# Patient Record
Sex: Male | Born: 1990 | Hispanic: Yes | Marital: Single | Smoking: Never smoker
Health system: Southern US, Community
[De-identification: ages and names within clinical notes are randomized; demographics above are authoritative.]

## PROBLEM LIST (undated history)

## (undated) ENCOUNTER — Emergency Department (HOSPITAL_COMMUNITY): Admission: EM | Payer: BC Managed Care – PPO

## (undated) DIAGNOSIS — M109 Gout, unspecified: Secondary | ICD-10-CM

## (undated) HISTORY — PX: NO PAST SURGERIES: SHX2092

---

## 2016-10-29 ENCOUNTER — Ambulatory Visit (HOSPITAL_COMMUNITY)
Admission: EM | Admit: 2016-10-29 | Discharge: 2016-10-29 | Disposition: A | Discharge status: Home / Self Care | Payer: Self-pay | Attending: Family Medicine | Admitting: Family Medicine

## 2016-10-29 ENCOUNTER — Encounter (HOSPITAL_COMMUNITY): Payer: Self-pay | Admitting: Emergency Medicine

## 2016-10-29 DIAGNOSIS — M1990 Unspecified osteoarthritis, unspecified site: Secondary | ICD-10-CM

## 2016-10-29 DIAGNOSIS — M25571 Pain in right ankle and joints of right foot: Secondary | ICD-10-CM

## 2016-10-29 MED ORDER — TRAMADOL HCL 50 MG PO TABS: ORAL_TABLET | ORAL | 0 refills | Status: DC

## 2016-10-29 MED ORDER — PREDNISONE 5 MG (48) PO TBPK: 5.0000 mg | ORAL_TABLET | Freq: Every day | ORAL | 0 refills | Status: DC

## 2016-10-29 NOTE — Discharge Instructions (Signed)
You have inflammation in your ankle that may be gout. This is most likely. If this problem returns again then please f/u at my practice. The medication will help this acutely.

## 2016-10-29 NOTE — ED Provider Notes (Signed)
CSN: [id number]     Arrival date & time 10/29/16  1435 History   First MD Initiated Contact with Patient 10/29/16 1608     Chief Complaint  Patient presents with  . Ankle Pain   (Consider location/radiation/quality/duration/timing/severity/associated sxs/prior Treatment) 26 yo male presents with pain and swelling of the right foot. Onset 3 days. No injury. "Very painful". A few weeks ago it occurred in the left foot but eventually went away. He has pain with walking and even touching the foot. No fever or chills are noted, otherwise feels well.       History reviewed. No pertinent past medical history. History reviewed. No pertinent surgical history. No family history on file. Social History  Substance Use Topics  . Smoking status: Never Smoker  . Smokeless tobacco: Not on file  . Alcohol use No    Review of Systems  Constitutional: Negative for chills and fever.  All other systems reviewed and are negative.   Allergies  Patient has no known allergies.  Home Medications   Prior to Admission medications   Medication Sig Start Date End Date Taking? Authorizing Provider  predniSONE (STERAPRED UNI-PAK 48 TAB) 5 MG (48) TBPK tablet Take 1 tablet (5 mg total) by mouth daily. Take as directed for gout flare 10/29/16   Riki SheerMichelle G Young, PA-C  traMADol Janean Sark(ULTRAM) 50 MG tablet Take 1 tablet every 6 hours as needed for pain 10/29/16   Riki SheerMichelle G Young, PA-C   Meds Ordered and Administered this Visit  Medications - No data to display  BP 130/73 (BP Location: Right Arm) Comment (BP Location): large cuff  Pulse 83   Temp 98.2 F (36.8 C) (Oral)   Resp 22   SpO2 99%  No data found.   Physical Exam  Constitutional: He is oriented to person, place, and time. He appears well-developed and well-nourished. No distress.  Musculoskeletal: He exhibits edema and tenderness. He exhibits no deformity.  Right lateral foot with erythema, warmth and swelling. Pain with touch, no open wounds or  signs of cellulitis  Neurological: He is alert and oriented to person, place, and time.  Skin: Skin is warm and dry. He is not diaphoretic.  Psychiatric: His behavior is normal.  Nursing note and vitals reviewed.   Urgent Care Course   Clinical Course     Procedures (including critical care time)  Labs Review Labs Reviewed - No data to display  Imaging Review No results found.   Visual Acuity Review  Right Eye Distance:   Left Eye Distance:   Bilateral Distance:    Right Eye Near:   Left Eye Near:    Bilateral Near:         MDM   1. Acute right ankle pain   2. Inflammatory arthritis    Inflammatory Arthritis, most likely gout given the presentation. No outward signs of infection. Treat with pred pack and Tramadol if needed. If this continues to reoccur needs a work up by rheum, information given.     Riki SheerMichelle G Young, PA-C 10/29/16 1642

## 2016-11-06 ENCOUNTER — Encounter (HOSPITAL_COMMUNITY): Payer: Self-pay | Admitting: Emergency Medicine

## 2016-11-06 ENCOUNTER — Emergency Department (HOSPITAL_COMMUNITY)
Admission: EM | Admit: 2016-11-06 | Discharge: 2016-11-06 | Disposition: A | Discharge status: Home / Self Care | Payer: Self-pay | Attending: Emergency Medicine | Admitting: Emergency Medicine

## 2016-11-06 ENCOUNTER — Emergency Department (HOSPITAL_COMMUNITY): Payer: Self-pay

## 2016-11-06 DIAGNOSIS — M25572 Pain in left ankle and joints of left foot: Secondary | ICD-10-CM | POA: Insufficient documentation

## 2016-11-06 DIAGNOSIS — M25571 Pain in right ankle and joints of right foot: Secondary | ICD-10-CM | POA: Insufficient documentation

## 2016-11-06 LAB — CBG MONITORING, ED: GLUCOSE-CAPILLARY: 121 mg/dL — AB (ref 65–99)

## 2016-11-06 MED ORDER — NAPROXEN 500 MG PO TABS: 500.0000 mg | ORAL_TABLET | Freq: Two times a day (BID) | ORAL | 0 refills | Status: DC

## 2016-11-06 NOTE — ED Triage Notes (Signed)
Pt c/o pain to B/L feet x 1 week. Pt reports pain only occurs with ambulation.

## 2016-11-06 NOTE — ED Notes (Signed)
Pt stable, understands discharge instructions, and reasons for return.   

## 2016-11-06 NOTE — ED Notes (Signed)
Pt understands dc instructions no sig pad available.

## 2016-11-06 NOTE — ED Provider Notes (Signed)
[facility]-EMERGENCY DEPT Provider Note   CSN: [id number] Arrival date & time: 11/06/16  1315     History   Chief Complaint Chief Complaint  Patient presents with  . Foot Pain    HPI Jesus Zuniga is a 26 y.o. male.  HPI Patient has had ankle pain that started right [place]. No associated injury or trauma. He reports first it was his right ankle. He reports it was more swollen previously. The pain occurs below the lateral malleolus. He reports when he puts weight on it he'll have pain in the sole of the foot. Pain has now migrated to include his left foot. Same pain distribution being inferior to the malleolus and with weightbearing pain at the sole. He reports after a while gets to be a burning quality of pain. Patient was treated with prednisone and tramadol. He reports a right ankle is somewhat better than it was previously. No other joint swellings or pain. Patient has not otherwise been ill and has a negative review of systems. No known history of diabetes. History reviewed. No pertinent past medical history.  There are no active problems to display for this patient.   History reviewed. No pertinent surgical history.     Home Medications    Prior to Admission medications   Medication Sig Start Date End Date Taking? Authorizing Provider  naproxen (NAPROSYN) 500 MG tablet Take 1 tablet (500 mg total) by mouth 2 (two) times daily. 11/06/16   Arby BarretteMarcy Lakechia Nay, MD  predniSONE (STERAPRED UNI-PAK 48 TAB) 5 MG (48) TBPK tablet Take 1 tablet (5 mg total) by mouth daily. Take as directed for gout flare 10/29/16   Riki SheerMichelle G Young, PA-C  traMADol Janean Sark(ULTRAM) 50 MG tablet Take 1 tablet every 6 hours as needed for pain 10/29/16   Riki SheerMichelle G Young, PA-C    Family History No family history on file.  Social History Social History  Substance Use Topics  . Smoking status: Never Smoker  . Smokeless tobacco: Never Used  . Alcohol use No     Allergies   Patient has no known  allergies.   Review of Systems Review of Systems 10 Systems reviewed and are negative for acute change except as noted in the HPI.   Physical Exam Updated Vital Signs BP 136/90   Pulse 90   Temp 98.3 F (36.8 C) (Oral)   Resp 20   Ht 5\' 10"  (1.778 m)   Wt (!) 390 lb 5 oz (177 kg)   SpO2 98%   BMI 56.00 kg/m   Physical Exam  Constitutional: He is oriented to person, place, and time.  Patient is alert and nontoxic. He is well in appearance. Morbid obesity.  HENT:  Head: Normocephalic and atraumatic.  Eyes: EOM are normal.  Cardiovascular: Normal rate, regular rhythm, normal heart sounds and intact distal pulses.   Pulmonary/Chest: Effort normal and breath sounds normal.  Musculoskeletal: Normal range of motion. He exhibits tenderness. He exhibits no deformity.  Patient has slight fullness of the right infra-malleolar area. Mild tenderness to palpation. No point tenderness on the bony structures. No erythema. Sole of the foot is normal. Thickening of the great toenail consistent with onychomycosis. Left foot mild tenderness below the malleolus. No significant swelling. No erythema. Skin condition normal.  Calves are soft and nontender. No effusion or pain at the knees. Joints of hands examined with no erythema swelling or pain.  Neurological: He is alert and oriented to person, place, and time. He exhibits normal muscle  tone. Coordination normal.  Skin: Skin is warm and dry.  Psychiatric: He has a normal mood and affect.     ED Treatments / Results  Labs (all labs ordered are listed, but only abnormal results are displayed) Labs Reviewed  CBG MONITORING, ED - Abnormal; Notable for the following:       Result Value   Glucose-Capillary 121 (*)    All other components within normal limits    EKG  EKG Interpretation None       Radiology Dg Ankle Complete Left  Result Date: 11/06/2016 CLINICAL DATA:  Pain without trauma EXAM: LEFT ANKLE COMPLETE - 3+ VIEW  COMPARISON:  None. FINDINGS: No fracture or dislocation.  Mild lateral soft tissue swelling. IMPRESSION: Mild lateral soft tissue swelling.  No underlying fracture. Electronically Signed   By: Gerome Sam III M.D   On: 11/06/2016 18:13   Dg Ankle Complete Right  Result Date: 11/06/2016 CLINICAL DATA:  Pain over the lateral malleolus of both ankles with no known trauma. Right ankle pain for 2 weeks and left ankle pain for 2 days. EXAM: RIGHT ANKLE - COMPLETE 3+ VIEW COMPARISON:  None. FINDINGS: Mild swelling over the lateral malleolus. A physeal scar is identified. No fracture is noted. The ankle mortise is intact. Lucency projected over the distal tibia and fibula on the lateral view is thought to represent a vascular channel given the lack of a trauma history and the lack of abnormality on the frontal view. IMPRESSION: Mild swelling over the lateral malleolus.  No fracture identified. Electronically Signed   By: Gerome Sam III M.D   On: 11/06/2016 18:11    Procedures Procedures (including critical care time)  Medications Ordered in ED Medications - No data to display   Initial Impression / Assessment and Plan / ED Course  I have reviewed the triage vital signs and the nursing notes.  Pertinent labs & imaging results that were available during my care of the patient were reviewed by me and considered in my medical decision making (see chart for details).  Clinical Course      Final Clinical Impressions(s) / ED Diagnoses   Final diagnoses:  Acute bilateral ankle pain  Morbid obesity (HCC)   Patient presents with bilateral ankle pain which at this time is less suggestive of gout. He does not have erythema and does not have significant tenderness to touch. He describes pain worse with standing and pain increased with burning sensation at the sole of the foot close to the plantar fascia insertion of the heel and inferior to the malleolus. Patient does have risk factors for gout and  he does describe more redness, swelling and pain last week when he was seen. At this time however consideration is given to peroneal tendinitis. Patient is morbidly obese. My suspicion is there may be tendinitis due to overuse syndrome and gait anomalies due to obesity. The patient is counseled on the necessity of primary care management to start weight loss and for monitoring for risk of diabetes and hypertension. At this time, the patient is otherwise healthy with no other known medical problems and negative review of systems. He is advised to obtain ankle supports to wear when ambulatory and follow-up with the podiatry for potential support devices and monitoring of response to this by primary care physician. New Prescriptions New Prescriptions   NAPROXEN (NAPROSYN) 500 MG TABLET    Take 1 tablet (500 mg total) by mouth 2 (two) times daily.     Lebron Conners  Donnald Garre, MD 11/06/16 1910

## 2016-11-06 NOTE — Discharge Instructions (Signed)
You need a family doctor to monitor you for hypertension and diabetes and assist in weight loss management. Use the resource guide to find a family doctor or go to the [facility] to start the [facility] process. Also, follow up at the podiatrist for further assessment of your ankle pain.

## 2018-10-26 ENCOUNTER — Emergency Department (HOSPITAL_COMMUNITY)
Admission: EM | Admit: 2018-10-26 | Discharge: 2018-10-26 | Disposition: A | Discharge status: Home / Self Care | Payer: BLUE CROSS/BLUE SHIELD | Attending: Emergency Medicine | Admitting: Emergency Medicine

## 2018-10-26 ENCOUNTER — Other Ambulatory Visit: Payer: Self-pay

## 2018-10-26 ENCOUNTER — Encounter (HOSPITAL_COMMUNITY): Payer: Self-pay

## 2018-10-26 ENCOUNTER — Emergency Department (HOSPITAL_COMMUNITY): Payer: BLUE CROSS/BLUE SHIELD

## 2018-10-26 DIAGNOSIS — M79671 Pain in right foot: Secondary | ICD-10-CM | POA: Diagnosis not present

## 2018-10-26 MED ORDER — NAPROXEN 500 MG PO TABS: 500.0000 mg | ORAL_TABLET | Freq: Two times a day (BID) | ORAL | 0 refills | Status: DC

## 2018-10-26 NOTE — Discharge Instructions (Signed)
Please read and follow all provided instructions.  You have been seen today for foot pain, it is possible that you have gout, we are treating this with naproxen.  We would like you to follow-up with orthopedics.  Tests performed today include: An x-ray of the affected area - does NOT show any broken bones or dislocations.  Vital signs. See below for your results today.   Home care instructions: Ice- Do not apply ice pack directly to your skin, place towel or similar between your skin and ice/ice pack. Apply ice for 20 min, then remove for 40 min while awake Elevate affected extremity above the level of your heart when not walking around for the first 24-48 hours   Medications:  - Naproxen is a nonsteroidal anti-inflammatory medication that will help with pain and swelling. Be sure to take this medication as prescribed with food, 1 pill every 12 hours,  It should be taken with food, as it can cause stomach upset, and more seriously, stomach bleeding. Do not take other nonsteroidal anti-inflammatory medications with this such as Advil, Motrin, Aleve, Mobic, Goodie Powder, or Motrin.    You make take Tylenol per over the counter dosing with these medications.   We have prescribed you new medication(s) today. Discuss the medications prescribed today with your pharmacist as they can have adverse effects and interactions with your other medicines including over the counter and prescribed medications. Seek medical evaluation if you start to experience new or abnormal symptoms after taking one of these medicines, seek care immediately if you start to experience difficulty breathing, feeling of your throat closing, facial swelling, or rash as these could be indications of a more serious allergic reaction   Follow-up instructions: Please follow-up with your primary care provider or the provided orthopedic physician (bone specialist) if you continue to have significant pain in 1 week.  Return  instructions:  Please return if your digits or extremity are numb or tingling, appear gray or blue, or you have severe pain (also elevate the extremity and loosen splint or wrap if you were given one) Please return if you have redness or fevers.  Please return to the Emergency Department if you experience worsening symptoms.  Please return if you have any other emergent concerns. Additional Information:  Your vital signs today were: BP 132/76    Pulse 74    Temp 98.4 F (36.9 C)    Resp 18    SpO2 97%  If your blood pressure (BP) was elevated above 135/85 this visit, please have this repeated by your doctor within one month. ---------------

## 2018-10-26 NOTE — ED Notes (Signed)
ED Provider at bedside. 

## 2018-10-26 NOTE — ED Provider Notes (Signed)
[facility] EMERGENCY DEPARTMENT Provider Note   CSN: [id number] Arrival date & time: 10/26/18  1121     History   Chief Complaint Chief Complaint  Patient presents with  . Foot Pain    HPI Jesus Zuniga is a 28 y.o. male without significant past medical history who presents to the emergency department with complaint of right foot pain for the past 3-4 days.  Patient states he is having pain primarily to the first MTP joint which radiates to the/midfoot.  Pain is severe, worse with movement and with weightbearing, mildly alleviated by Advil. States initially the 1st MTP was fairly red. 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 Denies injury, fever, chills, numbness, tingling, weakness, or warmth to the area.   HPI  History reviewed. No pertinent past medical history.  There are no active problems to display for this patient.   History reviewed. No pertinent surgical history.      Home Medications    Prior to Admission medications   Medication Sig Start Date End Date Taking? Authorizing Provider  naproxen (NAPROSYN) 500 MG tablet Take 1 tablet (500 mg total) by mouth 2 (two) times daily. 11/06/16   Arby Barrette, MD  predniSONE (STERAPRED UNI-PAK 48 TAB) 5 MG (48) TBPK tablet Take 1 tablet (5 mg total) by mouth daily. Take as directed for gout flare 10/29/16   Riki Sheer, PA-C  traMADol Janean Sark) 50 MG tablet Take 1 tablet every 6 hours as needed for pain 10/29/16   Riki Sheer, PA-C    Family History No family history on file.  Social History Social History   Tobacco Use  . Smoking status: Never Smoker  . Smokeless tobacco: Never Used  Substance Use Topics  . Alcohol use: No  . Drug use: No     Allergies   Patient has no known allergies.   Review of Systems Review of Systems  Constitutional: Negative for chills and fever.  Musculoskeletal: Positive for arthralgias.  Skin: Positive  for color change. Negative for wound.  Neurological: Negative for weakness and numbness.     Physical Exam Updated Vital Signs BP 132/76   Pulse 74   Temp 98.4 F (36.9 C)   Resp 18   SpO2 97%   Physical Exam Vitals signs and nursing note reviewed.  Constitutional:      General: He is not in acute distress.    Appearance: He is well-developed.  HENT:     Head: Normocephalic and atraumatic.  Eyes:     General:        Right eye: No discharge.        Left eye: No discharge.     Conjunctiva/sclera: Conjunctivae normal.  Cardiovascular:     Pulses:          Dorsalis pedis pulses are 2+ on the right side and 2+ on the left side.       Posterior tibial pulses are 2+ on the right side and 2+ on the left side.  Musculoskeletal:     Comments: Lower extremities: R foot: patient has some interdigit maceration to webspace between digits 2&3 without significant open wound or surrounding erythema/warmth or drainage. 1st MTP with some mild erythema and swelling medially. Not warm to touch. No breaks in the skin. Patient has full AROM to the ankles and all digits. He is tender most primarily over the 1st MTP w/ some additional mild tenderness to dorsal/plantar forefoot &  midfoot. NVI distally.   Neurological:     Mental Status: He is alert.     Comments: Clear speech. Sensation grossly intact to bilateral lower extremities. 5/5 strength with plantar/dorsiflexion. Ambulatory with antalgic gait, prefers crutches.   Psychiatric:        Behavior: Behavior normal.        Thought Content: Thought content normal.      ED Treatments / Results  Labs (all labs ordered are listed, but only abnormal results are displayed) Labs Reviewed - No data to display  EKG None  Radiology Dg Foot Complete Right  Result Date: 10/26/2018 CLINICAL DATA:  Lateral foot pain for 1 week, atraumatic. EXAM: RIGHT FOOT COMPLETE - 3+ VIEW COMPARISON:  None. FINDINGS: There is no evidence of fracture or dislocation.  There is no evidence of arthropathy or other focal bone abnormality. No focal soft tissue finding. IMPRESSION: Negative. Electronically Signed   By: Marnee SpringJonathon  Watts M.D.   On: 10/26/2018 12:43    Procedures Procedures (including critical care time)  Medications Ordered in ED Medications - No data to display   Initial Impression / Assessment and Plan / ED Course  I have reviewed the triage vital signs and the nursing notes.  Pertinent labs & imaging results that were available during my care of the patient were reviewed by me and considered in my medical decision making (see chart for details).    Patient presents to the ED with complaints of pain to the R foot, primarily to 1st MTP. Exam without obvious deformity or open wounds. There is some mild swelling and erythema to the 1st MTP, not warm to touch, afebrile, ROM intact, feel that septic joint is less likely. Tender to palpation over MTP primarily, somewhat to forefoot/midfoot. NVI distally. Xray negative for fracture/dislocation. Suspect gout vs. MSK. PRICE recommended. Naproxen prescribed. Orthopedics follow up. I discussed results, treatment plan, need for follow-up, and return precautions with the patient. Provided opportunity for questions, patient confirmed understanding and are in agreement with plan.   Final Clinical Impressions(s) / ED Diagnoses   Final diagnoses:  Foot pain, right    ED Discharge Orders         Ordered    naproxen (NAPROSYN) 500 MG tablet  2 times daily     10/26/18 1359           Skippy Marhef[place], [place] Bend[place]antha R, PA-C 10/26/18 1411    Terrilee FilesButler, Michael C, MD 10/27/18 1310

## 2018-10-26 NOTE — ED Triage Notes (Signed)
Pt here with c/o toe pain on the right foot , pt able to walk on it with crutches , no trauma noted

## 2018-12-16 ENCOUNTER — Encounter (HOSPITAL_COMMUNITY): Payer: Self-pay | Admitting: Emergency Medicine

## 2018-12-16 ENCOUNTER — Emergency Department (HOSPITAL_COMMUNITY)
Admission: EM | Admit: 2018-12-16 | Discharge: 2018-12-17 | Disposition: A | Discharge status: Home / Self Care | Payer: BLUE CROSS/BLUE SHIELD | Attending: Emergency Medicine | Admitting: Emergency Medicine

## 2018-12-16 ENCOUNTER — Other Ambulatory Visit: Payer: Self-pay

## 2018-12-16 DIAGNOSIS — Z79899 Other long term (current) drug therapy: Secondary | ICD-10-CM | POA: Insufficient documentation

## 2018-12-16 DIAGNOSIS — M79675 Pain in left toe(s): Secondary | ICD-10-CM | POA: Diagnosis present

## 2018-12-16 DIAGNOSIS — M79672 Pain in left foot: Secondary | ICD-10-CM | POA: Insufficient documentation

## 2018-12-16 NOTE — ED Triage Notes (Signed)
C/o L foot pain that started today.  No known injury.  States history of same when it gets cold and he is usually out of work for a week when this happens.  Pt weight bearing on arrival and is using crutches.

## 2018-12-17 NOTE — ED Provider Notes (Signed)
[facility] EMERGENCY DEPARTMENT Provider Note   CSN: [id number] Arrival date & time: 12/16/18  2250    History   Chief Complaint Chief Complaint  Patient presents with  . Foot Pain    HPI Regional Hovan is a 28 y.o. male.     HPI   Abhi Quint is a 28 y.o. male, presenting to the ED with left first toe pain beginning today.  Pain is throbbing, moderate, nonradiating, worse with ambulation.  States he has had this pain before and has had previous diagnoses of gout.  He was last seen for similar pain here in the ED last month, prescribed naproxen, and states this cleared his symptoms within a few days. Denies fever/chills, numbness, weakness, injuries, or any other complaints.    History reviewed. No pertinent past medical history.  There are no active problems to display for this patient.   History reviewed. No pertinent surgical history.      Home Medications    Prior to Admission medications   Medication Sig Start Date End Date Taking? Authorizing Provider  naproxen (NAPROSYN) 500 MG tablet Take 1 tablet (500 mg total) by mouth 2 (two) times daily. 10/26/18   Petrucelli, Pleas Koch, PA-C  traMADol (ULTRAM) 50 MG tablet Take 1 tablet every 6 hours as needed for pain 10/29/16   Riki Sheer, PA-C    Family History No family history on file.  Social History Social History   Tobacco Use  . Smoking status: Never Smoker  . Smokeless tobacco: Never Used  Substance Use Topics  . Alcohol use: No  . Drug use: No     Allergies   Patient has no known allergies.   Review of Systems Review of Systems  Constitutional: Negative for fever.  Musculoskeletal: Positive for arthralgias.  Neurological: Negative for weakness and numbness.     Physical Exam Updated Vital Signs BP 126/67 (BP Location: Left Arm)   Pulse 84   Temp 98.1 F (36.7 C) (Oral)   Resp 16   SpO2 97%   Physical Exam Vitals signs and nursing note reviewed.    Constitutional:      General: He is not in acute distress.    Appearance: He is well-developed. He is obese. He is not diaphoretic.  HENT:     Head: Normocephalic and atraumatic.  Eyes:     Conjunctiva/sclera: Conjunctivae normal.  Neck:     Musculoskeletal: Neck supple.  Cardiovascular:     Rate and Rhythm: Normal rate and regular rhythm.     Pulses:          Dorsalis pedis pulses are 2+ on the left side.       Posterior tibial pulses are 2+ on the left side.  Pulmonary:     Effort: Pulmonary effort is normal.  Musculoskeletal:        General: Tenderness present.     Comments: Tenderness and very mild erythema to the left first MTP joint.  Range of motion intact, though painful.  No noted erythema, pain, tenderness, or swelling to the remainder of the foot.  Skin:    General: Skin is warm and dry.     Capillary Refill: Capillary refill takes less than 2 seconds.     Coloration: Skin is not pale.  Neurological:     Mental Status: He is alert.     Comments: Sensation to light touch grossly intact in the left foot and especially in the left first toe. Motor function intact  in the toes of the left foot.  Psychiatric:        Behavior: Behavior normal.      ED Treatments / Results  Labs (all labs ordered are listed, but only abnormal results are displayed) Labs Reviewed - No data to display  EKG None  Radiology No results found.  Procedures Procedures (including critical care time)  Medications Ordered in ED Medications - No data to display   Initial Impression / Assessment and Plan / ED Course  I have reviewed the triage vital signs and the nursing notes.  Pertinent labs & imaging results that were available during my care of the patient were reviewed by me and considered in my medical decision making (see chart for details).        Patient presents with left first MTP pain without history of trauma.  History and physical exam findings suggest gout flare.  My  suspicion for septic joint is quite low based on history elements and physical exam findings.  We will have the patient reinitiate NSAIDs.  PCP versus orthopedic follow-up. The patient was given instructions for home care as well as return precautions. Patient voices understanding of these instructions, accepts the plan, and is comfortable with discharge.  Final Clinical Impressions(s) / ED Diagnoses   Final diagnoses:  Foot pain, left    ED Discharge Orders    None       Concepcion Living 12/17/18 0041    Ward, Layla Maw, DO 12/17/18 [phone]

## 2018-12-17 NOTE — Discharge Instructions (Addendum)
Your pain may be due to gout. Antiinflammatory medications: Take 600 mg of ibuprofen every 6 hours or 440 mg (over the counter dose) to 500 mg (prescription dose) of naproxen every 12 hours for the next 3 days. After this time, these medications may be used as needed for pain. Take these medications with food to avoid upset stomach. Choose only one of these medications, do not take them together. Acetaminophen (generic for Tylenol): Should you continue to have additional pain while taking the ibuprofen or naproxen, you may add in acetaminophen as needed. Your daily total maximum amount of acetaminophen from all sources should be limited to 4000mg /day for persons without liver problems, or 2000mg /day for those with liver problems. Ice: May apply ice to the area over the next 24 hours for 15 minutes at a time to reduce swelling. Elevation: Keep the extremity elevated as often as possible to reduce pain and inflammation. Support: You will be weight-bearing as tolerated, which means you can slowly start to put weight on the extremity and increase amount and frequency as pain allows. Exercises: Start by performing these exercises a few times a week, increasing the frequency until you are performing them twice daily.  Follow up: If symptoms are improving, you may follow up with your primary care provider for any continued management. If symptoms are not starting to improve within a week, you should follow up with the orthopedic specialist within two weeks. Return: Return to the ED for numbness, weakness, increasing pain, overall worsening symptoms, loss of function, or if symptoms are not improving, you have tried to follow up with the orthopedic specialist, and have been unable to do so.  For prescription assistance, may try using prescription discount sites or apps, such as goodrx.com

## 2018-12-18 ENCOUNTER — Ambulatory Visit (INDEPENDENT_AMBULATORY_CARE_PROVIDER_SITE_OTHER): Payer: BLUE CROSS/BLUE SHIELD | Admitting: Orthopaedic Surgery

## 2018-12-18 ENCOUNTER — Encounter (INDEPENDENT_AMBULATORY_CARE_PROVIDER_SITE_OTHER): Payer: Self-pay | Admitting: Orthopaedic Surgery

## 2018-12-18 ENCOUNTER — Ambulatory Visit (INDEPENDENT_AMBULATORY_CARE_PROVIDER_SITE_OTHER): Payer: BLUE CROSS/BLUE SHIELD

## 2018-12-18 DIAGNOSIS — M10072 Idiopathic gout, left ankle and foot: Secondary | ICD-10-CM

## 2018-12-18 MED ORDER — PREDNISONE 10 MG (21) PO TBPK: ORAL_TABLET | ORAL | 0 refills | Status: DC

## 2018-12-18 MED ORDER — COLCHICINE 0.6 MG PO TABS: ORAL_TABLET | ORAL | 0 refills | Status: DC

## 2018-12-18 NOTE — Progress Notes (Signed)
Office Visit Note   Patient: Jesus Zuniga           Date of Birth: Jun 13, 1991           MRN: [id number] Visit Date: 12/18/2018              Requested by: No referring provider defined for this encounter. PCP: Patient, No Pcp Per   Assessment & Plan: Visit Diagnoses:  1. Acute idiopathic gout of left foot     Plan: Impression is acute gouty attack left foot.  We will start the patient on a Sterapred taper and Colcrys.  He will follow-up with [place] in 2 weeks time for recheck.  In the meantime, we have obtained a uric acid level and we will call him with the results once they are in.  Follow-Up Instructions: Return in about 2 weeks (around 01/01/2019).   Orders:  Orders Placed This Encounter  Procedures  . XR Foot Complete Left  . Uric acid   Meds ordered this encounter  Medications  . predniSONE (STERAPRED UNI-PAK 21 TAB) 10 MG (21) TBPK tablet    Sig: Take as directed    Dispense:  21 tablet    Refill:  0  . colchicine 0.6 MG tablet    Sig: Take two pills on the first day, and then one pill bid until symptoms resolve    Dispense:  10 tablet    Refill:  0      Procedures: No procedures performed   Clinical Data: No additional findings.   Subjective: Chief Complaint  Patient presents with  . Left Foot - Pain    HPI patient is a pleasant 28 year old gentleman who presents our clinic today with acute onset left foot pain.  This began Sunday morning when he woke from his sleep.  He had immediate pain to the medial aspect of the left foot primarily to the MTP joint.  The pain is so bad that he is unable to wear a shoe.  He has been taking over-the-counter anti-inflammatories without relief of symptoms.  He does note a history of very similar symptoms to the right foot this past January.  He took naproxen which seemed to resolve his pain.  He also notes similar issues to his left ankle last year.  He has not been on any maintenance medication for what appears to be gout.   No previous uric acid level obtained.  Review of Systems as detailed in HPI.  All others reviewed and are negative.   Objective: Vital Signs: There were no vitals taken for this visit.  Physical Exam well-developed well-nourished gentleman in no acute distress.  Alert and oriented times.  Ortho Exam examination of his left foot reveals moderate swelling.  Mild to moderate erythema medial aspect just over the MTP joint.  Moderate tenderness to palpation throughout.  He is neurovascularly intact distally.  Specialty Comments:  No specialty comments available.  Imaging: Xr Foot Complete Left  Result Date: 12/18/2018 No acute or structural abnormalities    PMFS History: Patient Active Problem List   Diagnosis Date Noted  . Acute idiopathic gout of left foot 12/18/2018   History reviewed. No pertinent past medical history.  History reviewed. No pertinent family history.  History reviewed. No pertinent surgical history. Social History   Occupational History  . Not on file  Tobacco Use  . Smoking status: Never Smoker  . Smokeless tobacco: Never Used  Substance and Sexual Activity  . Alcohol use: No  .  Drug use: No  . Sexual activity: Not on file       

## 2018-12-19 LAB — URIC ACID: URIC ACID, SERUM: 6.5 mg/dL (ref 4.0–8.0)

## 2019-01-01 ENCOUNTER — Ambulatory Visit (INDEPENDENT_AMBULATORY_CARE_PROVIDER_SITE_OTHER): Payer: BLUE CROSS/BLUE SHIELD | Admitting: Orthopaedic Surgery

## 2019-01-01 ENCOUNTER — Encounter (INDEPENDENT_AMBULATORY_CARE_PROVIDER_SITE_OTHER): Payer: Self-pay | Admitting: Orthopaedic Surgery

## 2019-01-01 DIAGNOSIS — M10072 Idiopathic gout, left ankle and foot: Secondary | ICD-10-CM

## 2019-01-01 NOTE — Progress Notes (Signed)
   Office Visit Note   Patient: Jesus Zuniga           Date of Birth: [birth year]           MRN: [id number] Visit Date: 01/01/2019              Requested by: No referring provider defined for this encounter. PCP: Patient, No Pcp Per   Assessment & Plan: Visit Diagnoses:  1. Acute idiopathic gout of left foot     Plan: Impression is resolved left foot gout.  I have instructed the patient that he needs to establish care with a primary care physician for maintenance for his gouty attacks.  He will follow-up with [place] as needed.  Follow-Up Instructions: Return if symptoms worsen or fail to improve.   Orders:  No orders of the defined types were placed in this encounter.  No orders of the defined types were placed in this encounter.     Procedures: No procedures performed   Clinical Data: No additional findings.   Subjective: Chief Complaint  Patient presents with  . Left Foot - Pain    HPI patient is a pleasant 28 year old gentleman who presents to our clinic today for follow-up of his left foot.  History of acute gouty attack a few weeks back.  He was treated with prednisone and colchicine which dramatically improved his symptoms.  Uric acid levels drawn and were normal.  He has been doing very well over the past several days.  Ambulating without any issues.  No pain.  Review of Systems as detailed in HPI.  All others reviewed and are negative.   Objective: Vital Signs: There were no vitals taken for this visit.  Physical Exam well-developed and well-nourished gentleman in no acute distress.  Alert and oriented x3.  Ortho Exam normal exam of the left foot.  Specialty Comments:  No specialty comments available.  Imaging: No new imaging   PMFS History: Patient Active Problem List   Diagnosis Date Noted  . Acute idiopathic gout of left foot 12/18/2018   History reviewed. No pertinent past medical history.  History reviewed. No pertinent family history.    History reviewed. No pertinent surgical history. Social History   Occupational History  . Not on file  Tobacco Use  . Smoking status: Never Smoker  . Smokeless tobacco: Never Used  Substance and Sexual Activity  . Alcohol use: No  . Drug use: No  . Sexual activity: Not on file

## 2019-11-03 ENCOUNTER — Emergency Department (HOSPITAL_COMMUNITY)
Admission: EM | Admit: 2019-11-03 | Discharge: 2019-11-03 | Disposition: A | Discharge status: Home / Self Care | Payer: BC Managed Care – PPO | Attending: Emergency Medicine | Admitting: Emergency Medicine

## 2019-11-03 ENCOUNTER — Encounter (HOSPITAL_COMMUNITY): Payer: Self-pay | Admitting: Emergency Medicine

## 2019-11-03 ENCOUNTER — Emergency Department (HOSPITAL_COMMUNITY): Payer: BC Managed Care – PPO

## 2019-11-03 ENCOUNTER — Other Ambulatory Visit: Payer: Self-pay

## 2019-11-03 DIAGNOSIS — Y998 Other external cause status: Secondary | ICD-10-CM | POA: Insufficient documentation

## 2019-11-03 DIAGNOSIS — Y939 Activity, unspecified: Secondary | ICD-10-CM | POA: Diagnosis not present

## 2019-11-03 DIAGNOSIS — S86912A Strain of unspecified muscle(s) and tendon(s) at lower leg level, left leg, initial encounter: Secondary | ICD-10-CM | POA: Insufficient documentation

## 2019-11-03 DIAGNOSIS — X58XXXA Exposure to other specified factors, initial encounter: Secondary | ICD-10-CM | POA: Diagnosis not present

## 2019-11-03 DIAGNOSIS — Y929 Unspecified place or not applicable: Secondary | ICD-10-CM | POA: Diagnosis not present

## 2019-11-03 DIAGNOSIS — S96912A Strain of unspecified muscle and tendon at ankle and foot level, left foot, initial encounter: Secondary | ICD-10-CM | POA: Diagnosis not present

## 2019-11-03 DIAGNOSIS — S8992XA Unspecified injury of left lower leg, initial encounter: Secondary | ICD-10-CM | POA: Diagnosis present

## 2019-11-03 MED ORDER — PREDNISONE 10 MG (21) PO TBPK: ORAL_TABLET | ORAL | 0 refills | Status: DC

## 2019-11-03 MED ORDER — IBUPROFEN 800 MG PO TABS: 800.0000 mg | ORAL_TABLET | Freq: Three times a day (TID) | ORAL | 0 refills | Status: DC

## 2019-11-03 MED ORDER — PREDNISONE 20 MG PO TABS
60.0000 mg | ORAL_TABLET | Freq: Once | ORAL | Status: AC
  Administered 2019-11-03: 60 mg via ORAL
  Filled 2019-11-03: qty 3

## 2019-11-03 MED ORDER — KETOROLAC TROMETHAMINE 30 MG/ML IJ SOLN
30.0000 mg | Freq: Once | INTRAMUSCULAR | Status: AC
  Administered 2019-11-03: 30 mg via INTRAMUSCULAR
  Filled 2019-11-03: qty 1

## 2019-11-03 NOTE — ED Notes (Signed)
Patient verbalizes understanding of discharge instructions. Opportunity for questioning and answers were provided. Armband removed by staff, pt discharged from ED.  

## 2019-11-03 NOTE — ED Provider Notes (Signed)
[facility] EMERGENCY DEPARTMENT Provider Note   CSN: [id number] Arrival date & time: 11/03/19  1413     History Chief Complaint  Patient presents with   Knee Pain   Ankle Pain    Jesus Zuniga is a 29 y.o. male.  Pt presents to the ED today with left knee and ankle pain.  The pt denies any injury.  He's had gout in the past, but is not sure if this is gout.  He takes nothing for gout and has taken nothing for pain.  No f/c.  No redness.  His left and right ankles occasionally become swollen.  They are not swollen now.  No calf pain.  No sob or cp.        History reviewed. No pertinent past medical history.  Patient Active Problem List   Diagnosis Date Noted   Acute idiopathic gout of left foot 12/18/2018    History reviewed. No pertinent surgical history.     No family history on file.  Social History   Tobacco Use   Smoking status: Never Smoker   Smokeless tobacco: Never Used  Substance Use Topics   Alcohol use: No   Drug use: No    Home Medications Prior to Admission medications   Medication Sig Start Date End Date Taking? Authorizing Provider  colchicine 0.6 MG tablet Take two pills on the first day, and then one pill bid until symptoms resolve 12/18/18   Aundra Dubin, PA-C  ibuprofen (ADVIL) 800 MG tablet Take 1 tablet (800 mg total) by mouth 3 (three) times daily. 11/03/19   Isla Pence, MD  naproxen (NAPROSYN) 500 MG tablet Take 1 tablet (500 mg total) by mouth 2 (two) times daily. 10/26/18   Petrucelli, Samantha R, PA-C  predniSONE (STERAPRED UNI-PAK 21 TAB) 10 MG (21) TBPK tablet Take 6 tabs for 2 days, then 5 for 2 days, then 4 for 2 days, then 3 for 2 days, 2 for 2 days, then 1 for 2 days 11/03/19   Isla Pence, MD  traMADol Veatrice Bourbon) 50 MG tablet Take 1 tablet every 6 hours as needed for pain 10/29/16   Bjorn Pippin, PA-C    Allergies    Patient has no known allergies.  Review of Systems   Review of Systems    Musculoskeletal:       Left ankle and knee pain  All other systems reviewed and are negative.   Physical Exam Updated Vital Signs BP 133/71 (BP Location: Right Arm)    Pulse 78    Temp 97.8 F (36.6 C) (Oral)    Resp 18    SpO2 98%   Physical Exam Vitals and nursing note reviewed.  Constitutional:      Appearance: Normal appearance. He is obese.  HENT:     Head: Normocephalic and atraumatic.     Right Ear: External ear normal.     Left Ear: External ear normal.     Nose: Nose normal.     Mouth/Throat:     Mouth: Mucous membranes are moist.     Pharynx: Oropharynx is clear.  Eyes:     Extraocular Movements: Extraocular movements intact.     Conjunctiva/sclera: Conjunctivae normal.     Pupils: Pupils are equal, round, and reactive to light.  Cardiovascular:     Rate and Rhythm: Normal rate and regular rhythm.     Pulses: Normal pulses.     Heart sounds: Normal heart sounds.  Pulmonary:  Effort: Pulmonary effort is normal.     Breath sounds: Normal breath sounds.  Abdominal:     General: Abdomen is flat. Bowel sounds are normal.     Palpations: Abdomen is soft.  Musculoskeletal:     Cervical back: Normal range of motion and neck supple.       Legs:  Skin:    General: Skin is warm.     Capillary Refill: Capillary refill takes less than 2 seconds.  Neurological:     General: No focal deficit present.     Mental Status: He is alert and oriented to person, place, and time.  Psychiatric:        Mood and Affect: Mood normal.        Behavior: Behavior normal.        Thought Content: Thought content normal.        Judgment: Judgment normal.     ED Results / Procedures / Treatments   Labs (all labs ordered are listed, but only abnormal results are displayed) Labs Reviewed - No data to display  EKG None  Radiology DG Ankle Complete Left  Result Date: 11/03/2019 CLINICAL DATA:  Left ankle pain.  No reported injury. EXAM: LEFT ANKLE COMPLETE - 3+ VIEW  COMPARISON:  None. FINDINGS: No fracture or subluxation. No suspicious focal osseous lesions. No significant arthropathy. Small plantar left calcaneal spur. No radiopaque foreign body. IMPRESSION: Small plantar left calcaneal spur.  No acute osseous abnormality. Electronically Signed   By: Delbert Phenix M.D.   On: 11/03/2019 16:06   DG Knee Complete 4 Views Left  Result Date: 11/03/2019 CLINICAL DATA:  Pain EXAM: LEFT KNEE - COMPLETE 4+ VIEW COMPARISON:  None. FINDINGS: No evidence of fracture, dislocation, or joint effusion. No evidence of arthropathy or other focal bone abnormality. Soft tissues are unremarkable. IMPRESSION: Negative. Electronically Signed   By: Guadlupe Spanish M.D.   On: 11/03/2019 16:04    Procedures Procedures (including critical care time)  Medications Ordered in ED Medications  ketorolac (TORADOL) 30 MG/ML injection 30 mg (has no administration in time range)  predniSONE (DELTASONE) tablet 60 mg (has no administration in time range)    ED Course  I have reviewed the triage vital signs and the nursing notes.  Pertinent labs & imaging results that were available during my care of the patient were reviewed by me and considered in my medical decision making (see chart for details).    MDM Rules/Calculators/A&P                     Pain is likely due to body habitus, but it could be gout as he's had it in the past.  There is no redness currently.  I will put pt on prednisone, but hold off on colchicine.   Final Clinical Impression(s) / ED Diagnoses Final diagnoses:  Strain of left knee, initial encounter  Strain of left ankle, initial encounter    Rx / DC Orders ED Discharge Orders         Ordered    predniSONE (STERAPRED UNI-PAK 21 TAB) 10 MG (21) TBPK tablet     11/03/19 1610    ibuprofen (ADVIL) 800 MG tablet  3 times daily     11/03/19 1610           Jacalyn Lefevre, MD 11/03/19 1610

## 2019-11-03 NOTE — ED Triage Notes (Signed)
C/o L knee and L ankle pain since yesterday.  No known injury.  Ambulatory to triage.

## 2019-11-06 ENCOUNTER — Encounter (HOSPITAL_COMMUNITY): Payer: Self-pay

## 2019-11-06 ENCOUNTER — Ambulatory Visit (HOSPITAL_COMMUNITY)
Admission: EM | Admit: 2019-11-06 | Discharge: 2019-11-06 | Disposition: A | Discharge status: Home / Self Care | Payer: BC Managed Care – PPO

## 2019-11-06 ENCOUNTER — Other Ambulatory Visit: Payer: Self-pay

## 2019-11-06 DIAGNOSIS — M25572 Pain in left ankle and joints of left foot: Secondary | ICD-10-CM | POA: Diagnosis not present

## 2019-11-06 NOTE — ED Triage Notes (Addendum)
Pt presents to UC with left foot pain x 5 days. No known injury. Pt states he has the pain when he walks. Pt reports he was seen at the ED for the same chief complaint. Pt reports he is taking the prednisone and the ibuprofen prescribed at the ED.

## 2019-11-06 NOTE — Discharge Instructions (Signed)
Keep taking the medication as prescribed previously in the ER. Rest, ice and follow-up with orthopedic as needed Work note given

## 2019-11-07 NOTE — ED Provider Notes (Signed)
[facility]    CSN: [id number] Arrival date & time: 11/06/19  1337      History   Chief Complaint No chief complaint on file.   HPI Jesus Zuniga is a 29 y.o. male.   Patient is a 29 year old male with past medical history of gout.  He presents today with left foot/ ankle pain x5 days.  No injury.  Was seen in the ED 3 days ago and prescribed prednisone for the pain.  He has been taking this for 2 days.  Reporting the medication has been improving his pain.  He did x-rays at that time which were normal.  He is here today for work note.  Reporting he cannot put his shoes on to go to work.      History reviewed. No pertinent past medical history.  Patient Active Problem List   Diagnosis Date Noted  . Acute idiopathic gout of left foot 12/18/2018    History reviewed. No pertinent surgical history.     Home Medications    Prior to Admission medications   Medication Sig Start Date End Date Taking? Authorizing Provider  colchicine 0.6 MG tablet Take two pills on the first day, and then one pill bid until symptoms resolve 12/18/18   Cristie Hem, PA-C  ibuprofen (ADVIL) 800 MG tablet Take 1 tablet (800 mg total) by mouth 3 (three) times daily. 11/03/19   Jacalyn Lefevre, MD  naproxen (NAPROSYN) 500 MG tablet Take 1 tablet (500 mg total) by mouth 2 (two) times daily. 10/26/18   Petrucelli, Samantha R, PA-C  predniSONE (STERAPRED UNI-PAK 21 TAB) 10 MG (21) TBPK tablet Take 6 tabs for 2 days, then 5 for 2 days, then 4 for 2 days, then 3 for 2 days, 2 for 2 days, then 1 for 2 days 11/03/19   Jacalyn Lefevre, MD  traMADol Janean Sark) 50 MG tablet Take 1 tablet every 6 hours as needed for pain 10/29/16   Sharin Mons    Family History History reviewed. No pertinent family history.  Social History Social History   Tobacco Use  . Smoking status: Never Smoker  . Smokeless tobacco: Never Used  Substance Use Topics  . Alcohol use: No  . Drug use: No      Allergies   Patient has no known allergies.   Review of Systems Review of Systems  Musculoskeletal: Positive for arthralgias and joint swelling.  Skin: Negative for color change, pallor and wound.     Physical Exam Triage Vital Signs ED Triage Vitals  Enc Vitals Group     BP 11/06/19 1532 (!) 140/100     Pulse Rate 11/06/19 1532 72     Resp 11/06/19 1532 18     Temp 11/06/19 1532 98.3 F (36.8 C)     Temp Source 11/06/19 1532 Oral     SpO2 11/06/19 1532 100 %     Weight --      Height --      Head Circumference --      Peak Flow --      Pain Score 11/06/19 1529 6     Pain Loc --      Pain Edu? --      Excl. in GC? --    No data found.  Updated Vital Signs BP (!) 140/100 (BP Location: Right Wrist)   Pulse 72   Temp 98.3 F (36.8 C) (Oral)   Resp 18   SpO2 100%   Visual Acuity Right  Eye Distance:   Left Eye Distance:   Bilateral Distance:    Right Eye Near:   Left Eye Near:    Bilateral Near:     Physical Exam Vitals and nursing note reviewed.  Constitutional:      Appearance: Normal appearance.  HENT:     Head: Normocephalic and atraumatic.     Nose: Nose normal.  Eyes:     Conjunctiva/sclera: Conjunctivae normal.  Pulmonary:     Effort: Pulmonary effort is normal.  Musculoskeletal:        General: Normal range of motion.     Cervical back: Normal range of motion.  Skin:    General: Skin is warm and dry.  Neurological:     Mental Status: He is alert.  Psychiatric:        Mood and Affect: Mood normal.      UC Treatments / Results  Labs (all labs ordered are listed, but only abnormal results are displayed) Labs Reviewed - No data to display  EKG   Radiology No results found.  Procedures Procedures (including critical care time)  Medications Ordered in UC Medications - No data to display  Initial Impression / Assessment and Plan / UC Course  I have reviewed the triage vital signs and the nursing notes.  Pertinent labs &  imaging results that were available during my care of the patient were reviewed by me and considered in my medical decision making (see chart for details).     Pain in foot/ankle that has been improved with prednisone. Patient requesting work note until the problem resolves. Gave work note for the next couple days so he can rest Continue the prednisone and ice the area and elevate Final Clinical Impressions(s) / UC Diagnoses   Final diagnoses:  Pain of joint of left ankle and foot     Discharge Instructions     Keep taking the medication as prescribed previously in the ER. Rest, ice and follow-up with orthopedic as needed Work note given     ED Prescriptions    None     PDMP not reviewed this encounter.   Orvan July, NP 11/07/19 1447

## 2020-02-08 ENCOUNTER — Ambulatory Visit: Payer: Self-pay

## 2020-02-08 ENCOUNTER — Ambulatory Visit: Payer: BC Managed Care – PPO | Attending: Internal Medicine

## 2020-02-08 DIAGNOSIS — Z23 Encounter for immunization: Secondary | ICD-10-CM

## 2020-02-08 NOTE — Progress Notes (Signed)
   Covid-19 Vaccination Clinic  Name:  Truth Barot    MRN: [id number] DOB: 07/28/1991  02/08/2020  [name] was observed post Covid-19 immunization for 15 minutes without incident. He was provided with Vaccine Information Sheet and instruction to access the V-Safe system.   [name] was instructed to call 911 with any severe reactions post vaccine: Marland Kitchen Difficulty breathing  . Swelling of face and throat  . A fast heartbeat  . A bad rash all over body  . Dizziness and weakness   Immunizations Administered    Name Date Dose VIS Date Route   Pfizer COVID-19 Vaccine 02/08/2020  9:33 AM 0.3 mL 10/04/2019 Intramuscular   Manufacturer: [employer], [employer]   Lot: [id number]   NDC: [address]-4

## 2020-03-02 ENCOUNTER — Ambulatory Visit: Payer: BC Managed Care – PPO | Attending: Internal Medicine

## 2020-03-02 DIAGNOSIS — Z23 Encounter for immunization: Secondary | ICD-10-CM

## 2020-03-02 NOTE — Progress Notes (Signed)
   Covid-19 Vaccination Clinic  Name:  Jesus Zuniga    MRN: [id number] DOB: 04/29/1991  03/02/2020  Jesus Zuniga was observed post Covid-19 immunization for 15 minutes without incident. He was provided with Vaccine Information Sheet and instruction to access the V-Safe system.   Jesus Zuniga was instructed to call 911 with any severe reactions post vaccine: Marland Kitchen Difficulty breathing  . Swelling of face and throat  . A fast heartbeat  . A bad rash all over body  . Dizziness and weakness   Immunizations Administered    Name Date Dose VIS Date Route   Pfizer COVID-19 Vaccine 03/02/2020  9:44 AM 0.3 mL 12/18/2018 Intramuscular   Manufacturer: [employer], [employer]   Lot: [id number]   NDC: [address]-5

## 2020-03-25 ENCOUNTER — Ambulatory Visit (HOSPITAL_COMMUNITY)
Admission: EM | Admit: 2020-03-25 | Discharge: 2020-03-25 | Disposition: A | Discharge status: Home / Self Care | Payer: BC Managed Care – PPO | Attending: Internal Medicine | Admitting: Internal Medicine

## 2020-03-25 ENCOUNTER — Encounter (HOSPITAL_COMMUNITY): Payer: Self-pay

## 2020-03-25 ENCOUNTER — Other Ambulatory Visit: Payer: Self-pay

## 2020-03-25 DIAGNOSIS — S93401A Sprain of unspecified ligament of right ankle, initial encounter: Secondary | ICD-10-CM

## 2020-03-25 HISTORY — DX: Gout, unspecified: M10.9

## 2020-03-25 HISTORY — DX: Morbid (severe) obesity due to excess calories: E66.01

## 2020-03-25 MED ORDER — IBUPROFEN 600 MG PO TABS: 600.0000 mg | ORAL_TABLET | Freq: Three times a day (TID) | ORAL | 0 refills | Status: DC | PRN

## 2020-03-25 NOTE — ED Triage Notes (Signed)
Pt c/o 6/10 throbbing pain in right anklex2 days. Pt denies injury. Pt limped to exam room. Pt has 1+ edema of right ankle.

## 2020-03-26 NOTE — ED Provider Notes (Signed)
[place]    CSN: [id number] Arrival date & time: 03/25/20  0820      History   Chief Complaint Chief Complaint  Patient presents with  . Ankle Pain    HPI Jesus Zuniga is a 29 y.o. male comes to the urgent care with complaints of ankle pain of 3 days duration.  Patient denies any fall or sleeping.  Pain is of moderate severity, constant, throbbing, nonradiating, no relieving factors and aggravated by walking.  No fever or chills.  No bruising or bleeding from that area.  Patient has been evaluated in the past for ankle pain.  He has a remote history of gout.  Uric acid was 4.4 in the past.   HPI  Past Medical History:  Diagnosis Date  . Gout   . Morbid obesity [facility])     Patient Active Problem List   Diagnosis Date Noted  . Acute idiopathic gout of left foot 12/18/2018    History reviewed. No pertinent surgical history.     Home Medications    Prior to Admission medications   Medication Sig Start Date End Date Taking? Authorizing Provider  ibuprofen (ADVIL) 600 MG tablet Take 1 tablet (600 mg total) by mouth every 8 (eight) hours as needed. 03/25/20   Jesus Picket, MD  colchicine 0.6 MG tablet Take two pills on the first day, and then one pill bid until symptoms resolve 12/18/18 03/25/20  Jesus Dubin, PA-C    Family History Family History  Problem Relation Age of Onset  . Healthy Mother   . Healthy Father     Social History Social History   Tobacco Use  . Smoking status: Never Smoker  . Smokeless tobacco: Never Used  Substance Use Topics  . Alcohol use: No  . Drug use: No     Allergies   Patient has no known allergies.   Review of Systems Review of Systems  Constitutional: Negative for activity change, chills, fatigue and fever.  HENT: Negative.   Genitourinary: Negative for dysuria.  Musculoskeletal: Positive for arthralgias and joint swelling. Negative for neck pain and neck stiffness.  Skin: Negative.     Neurological: Negative for dizziness, light-headedness and headaches.     Physical Exam Triage Vital Signs ED Triage Vitals [03/25/20 0846]  Enc Vitals Group     BP (!) 142/82     Pulse Rate 79     Resp 18     Temp 97.9 F (36.6 C)     Temp Source Oral     SpO2 96 %     Weight (!) 450 lb (204.1 kg)     Height 5\' 10"  (1.778 m)     Head Circumference      Peak Flow      Pain Score 6     Pain Loc      Pain Edu?      Excl. in [place]?    No data found.  Updated Vital Signs BP (!) 142/82   Pulse 79   Temp 97.9 F (36.6 C) (Oral)   Resp 18   Ht 5\' 10"  (1.778 m)   Wt (!) 204.1 kg   SpO2 96%   BMI 64.57 kg/m   Visual Acuity Right Eye Distance:   Left Eye Distance:   Bilateral Distance:    Right Eye Near:   Left Eye Near:    Bilateral Near:     Physical Exam Vitals and nursing note reviewed.  Constitutional:  General: He is in acute distress.  Cardiovascular:     Rate and Rhythm: Normal rate and regular rhythm.  Pulmonary:     Effort: Pulmonary effort is normal.     Breath sounds: Normal breath sounds.  Musculoskeletal:        General: Normal range of motion.  Skin:    General: Skin is warm.     Capillary Refill: Capillary refill takes less than 2 seconds.  Neurological:     General: No focal deficit present.     Mental Status: He is alert.      UC Treatments / Results  Labs (all labs ordered are listed, but only abnormal results are displayed) Labs Reviewed - No data to display  EKG   Radiology No results found.  Procedures Procedures (including critical care time)  Medications Ordered in UC Medications - No data to display  Initial Impression / Assessment and Plan / UC Course  I have reviewed the triage vital signs and the nursing notes.  Pertinent labs & imaging results that were available during my care of the patient were reviewed by me and considered in my medical decision making (see chart for details).     1.  Right ankle  sprain likely secondary to patient's weight: Ibuprofen every 6 hours as needed for pain Ankle brace Rest, icing of the ankle Gentle range of motion exercises No indication for x-ray today Patient is advised to return to urgent care if he experiences worsening pain, swelling or redness. Final Clinical Impressions(s) / UC Diagnoses   Final diagnoses:  Sprain of right ankle, unspecified ligament, initial encounter   Discharge Instructions   None    ED Prescriptions    Medication Sig Dispense Auth. Provider   ibuprofen (ADVIL) 600 MG tablet Take 1 tablet (600 mg total) by mouth every 8 (eight) hours as needed. 30 tablet Jesus Zuniga, Jesus Mccreedy, MD     PDMP not reviewed this encounter.   Jesus Jansky, MD 03/26/20 [id number]

## 2020-03-30 ENCOUNTER — Ambulatory Visit (HOSPITAL_COMMUNITY)
Admission: EM | Admit: 2020-03-30 | Discharge: 2020-03-30 | Disposition: A | Discharge status: Home / Self Care | Payer: BC Managed Care – PPO

## 2020-03-30 ENCOUNTER — Encounter (HOSPITAL_COMMUNITY): Payer: Self-pay

## 2020-03-30 ENCOUNTER — Other Ambulatory Visit: Payer: Self-pay

## 2020-03-30 DIAGNOSIS — M25571 Pain in right ankle and joints of right foot: Secondary | ICD-10-CM | POA: Diagnosis not present

## 2020-03-30 NOTE — ED Triage Notes (Signed)
Pt presents for follow up for ongoing right leg pain and work note.

## 2020-03-30 NOTE — Discharge Instructions (Signed)
Keep taking the ibuprofen Rest, ice and elevate.  Work note given.

## 2020-03-30 NOTE — ED Provider Notes (Signed)
[facility]    CSN: [id number] Arrival date & time: 03/30/20  5364      History   Chief Complaint Chief Complaint  Patient presents with   Follow-up    HPI Jesus Zuniga is a 29 y.o. male.   Patient is a 20-year male with past medical history of gout and morbid obesity. He presents today with continued right ankle pain. This is a chronic issue for him. He was seen here recently and treated with ibuprofen and given crutches to stay off the ankle. He reports that the pain is somewhat improved and swelling has somewhat improved. He is here today requesting a few days more off of work.     Past Medical History:  Diagnosis Date   Gout    Morbid obesity [facility])     Patient Active Problem List   Diagnosis Date Noted   Acute idiopathic gout of left foot 12/18/2018    History reviewed. No pertinent surgical history.     Home Medications    Prior to Admission medications   Medication Sig Start Date End Date Taking? Authorizing Provider  ibuprofen (ADVIL) 600 MG tablet Take 1 tablet (600 mg total) by mouth every 8 (eight) hours as needed. 03/25/20   Merrilee Jansky, MD  colchicine 0.6 MG tablet Take two pills on the first day, and then one pill bid until symptoms resolve 12/18/18 03/25/20  Cristie Hem, PA-C    Family History Family History  Problem Relation Age of Onset   Healthy Mother    Healthy Father     Social History Social History   Tobacco Use   Smoking status: Never Smoker   Smokeless tobacco: Never Used  Substance Use Topics   Alcohol use: No   Drug use: No     Allergies   Patient has no known allergies.   Review of Systems Review of Systems   Physical Exam Triage Vital Signs ED Triage Vitals  Enc Vitals Group     BP 03/30/20 0856 (!) 149/81     Pulse Rate 03/30/20 0856 74     Resp 03/30/20 0856 17     Temp 03/30/20 0856 98.2 F (36.8 C)     Temp Source 03/30/20 0856 Oral     SpO2 03/30/20 0856 97 %   Weight --      Height --      Head Circumference --      Peak Flow --      Pain Score 03/30/20 0857 8     Pain Loc --      Pain Edu? --      Excl. in [place]? --    No data found.  Updated Vital Signs BP (!) 149/81 (BP Location: Right Arm)    Pulse 74    Temp 98.2 F (36.8 C) (Oral)    Resp 17    SpO2 97%   Visual Acuity Right Eye Distance:   Left Eye Distance:   Bilateral Distance:    Right Eye Near:   Left Eye Near:    Bilateral Near:     Physical Exam Vitals and nursing note reviewed.  Constitutional:      Appearance: Normal appearance.  HENT:     Head: Normocephalic and atraumatic.     Nose: Nose normal.  Eyes:     Conjunctiva/sclera: Conjunctivae normal.  Pulmonary:     Effort: Pulmonary effort is normal.  Musculoskeletal:        General: Normal range of  motion.     Cervical back: Normal range of motion.  Skin:    General: Skin is warm and dry.  Neurological:     Mental Status: He is alert.  Psychiatric:        Mood and Affect: Mood normal.      UC Treatments / Results  Labs (all labs ordered are listed, but only abnormal results are displayed) Labs Reviewed - No data to display  EKG   Radiology No results found.  Procedures Procedures (including critical care time)  Medications Ordered in UC Medications - No data to display  Initial Impression / Assessment and Plan / UC Course  I have reviewed the triage vital signs and the nursing notes.  Pertinent labs & imaging results that were available during my care of the patient were reviewed by me and considered in my medical decision making (see chart for details).     Ankle pain We will have him continue resting, icing elevating and take ibuprofen as needed. Work note given as requested Recommended sports medicine for follow up.  Final Clinical Impressions(s) / UC Diagnoses   Final diagnoses:  Pain in joint involving right ankle and foot     Discharge Instructions     Keep taking the  ibuprofen Rest, ice and elevate.  Work note given.     ED Prescriptions    None     PDMP not reviewed this encounter.   Loura Halt A, NP 03/30/20 1016

## 2020-11-02 DIAGNOSIS — R0781 Pleurodynia: Secondary | ICD-10-CM | POA: Diagnosis not present

## 2020-11-02 DIAGNOSIS — R918 Other nonspecific abnormal finding of lung field: Secondary | ICD-10-CM | POA: Diagnosis not present

## 2020-11-02 DIAGNOSIS — M62838 Other muscle spasm: Secondary | ICD-10-CM | POA: Diagnosis not present

## 2020-11-02 DIAGNOSIS — R059 Cough, unspecified: Secondary | ICD-10-CM | POA: Diagnosis not present

## 2020-11-02 DIAGNOSIS — U071 COVID-19: Secondary | ICD-10-CM | POA: Diagnosis not present

## 2020-11-02 DIAGNOSIS — M546 Pain in thoracic spine: Secondary | ICD-10-CM | POA: Diagnosis not present

## 2020-12-11 DIAGNOSIS — M109 Gout, unspecified: Secondary | ICD-10-CM | POA: Diagnosis not present

## 2021-07-15 IMAGING — DX DG KNEE COMPLETE 4+V*L*
5 series · 5 of 5 positions shown · non-contrast
Comparison: None.

CLINICAL DATA: Pain

EXAM:
LEFT KNEE - COMPLETE 4+ VIEW

[knee ap]
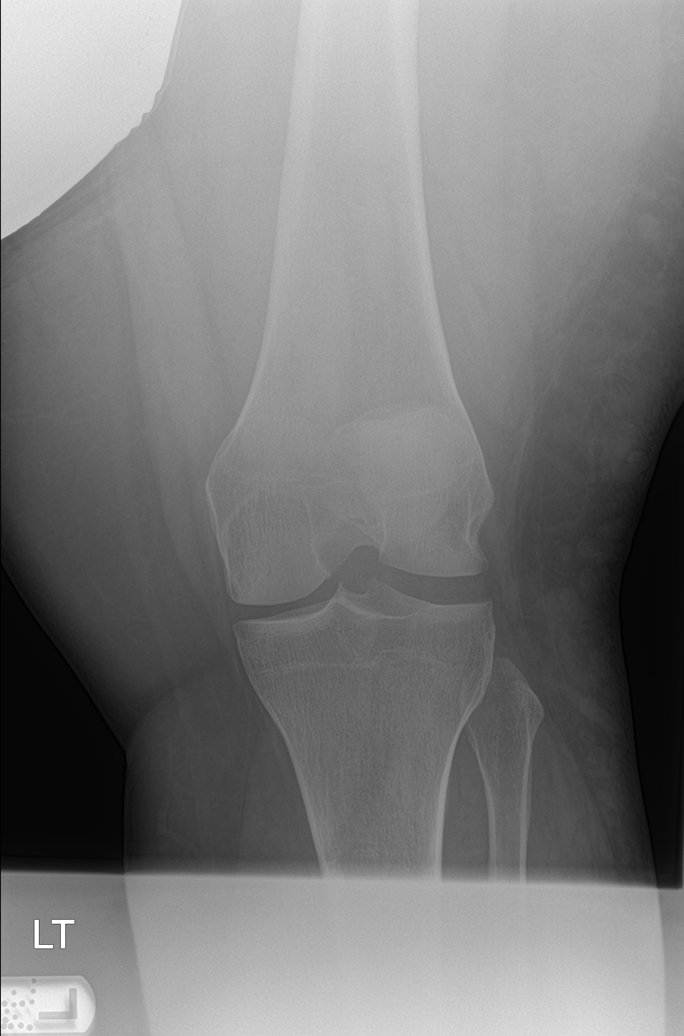

[knee sunrise]
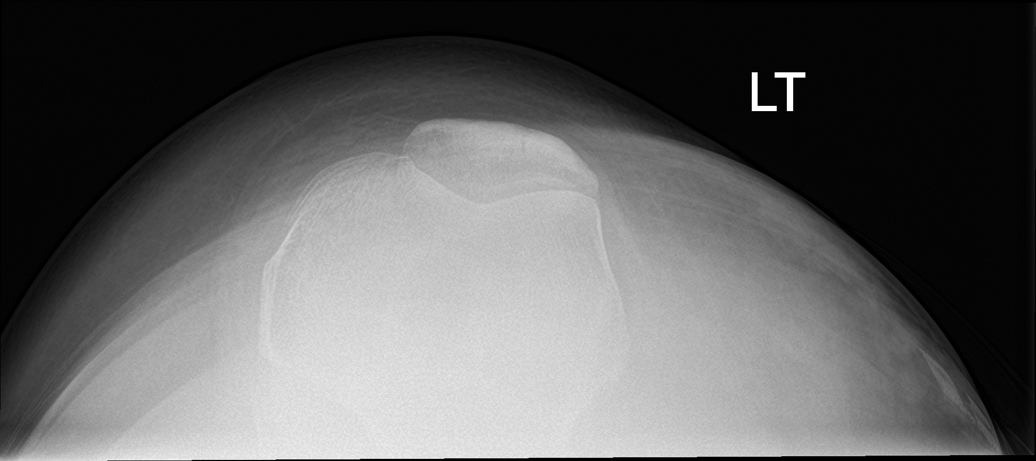

[knee obl (1 of 2)]
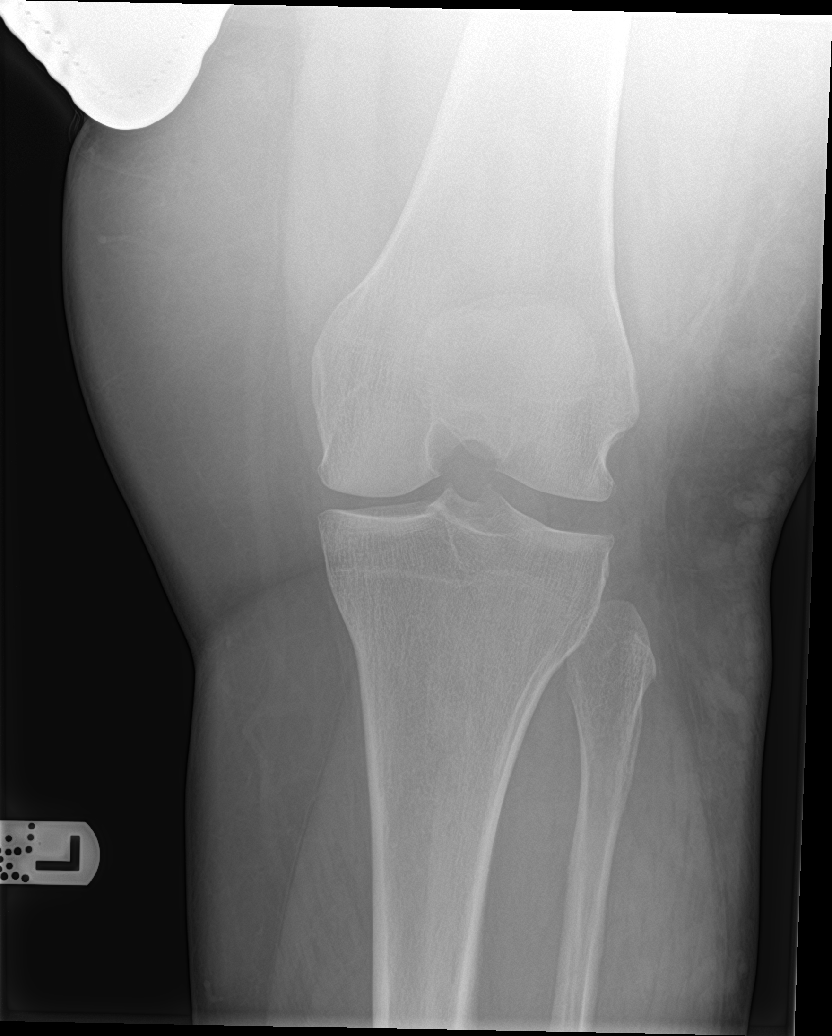

[knee obl (2 of 2)]
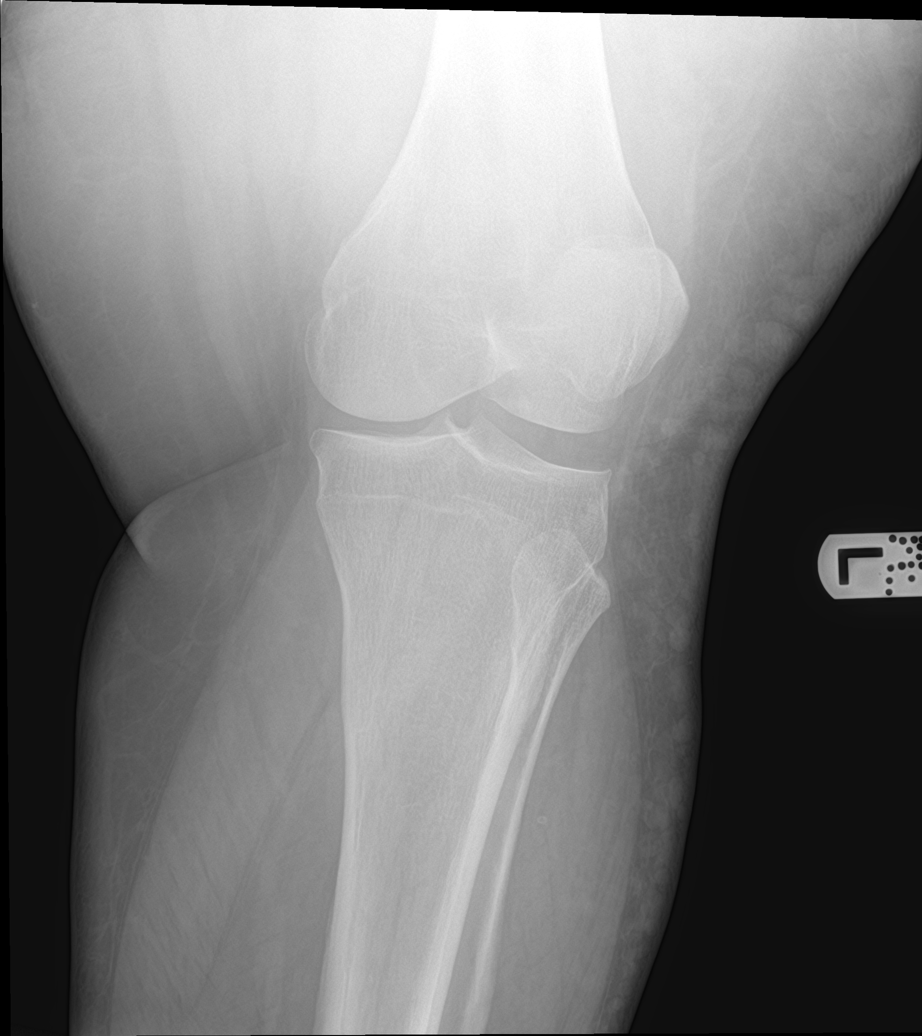

[knee lat]
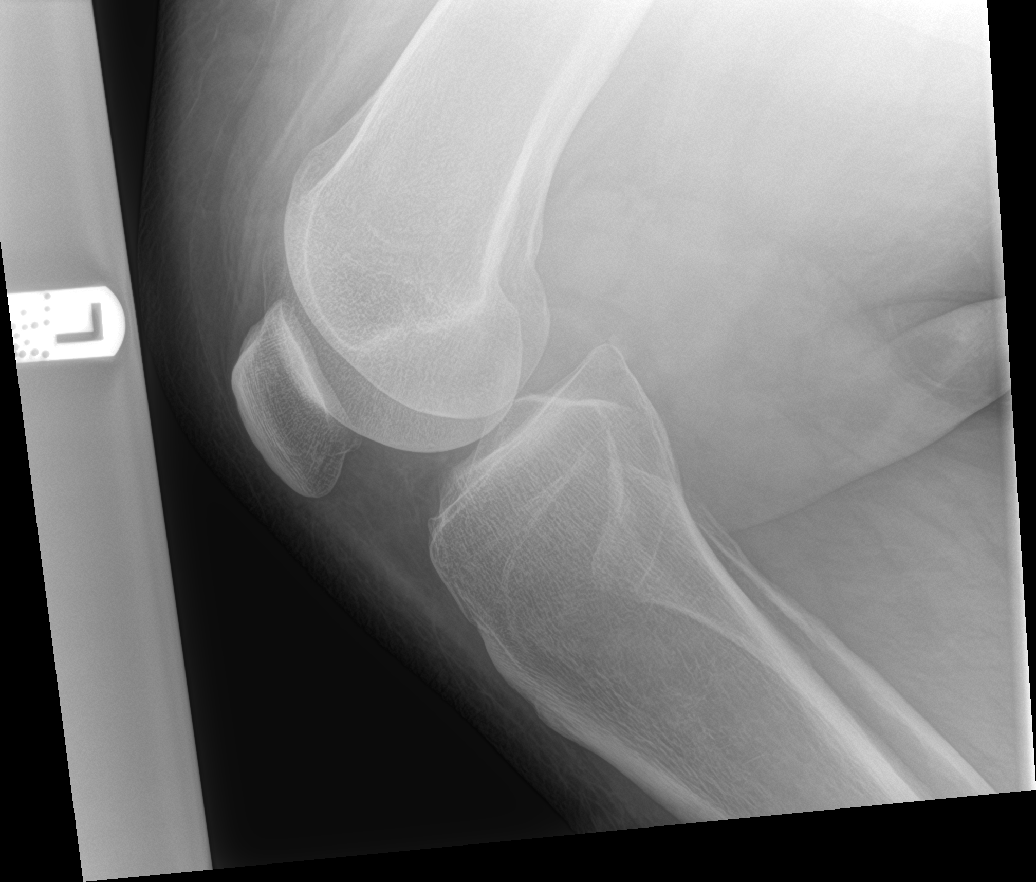

[5 of 5 positions shown; findings below may reference images not displayed]

FINDINGS: No evidence of fracture, dislocation, or joint effusion. No evidence
of arthropathy or other focal bone abnormality. Soft tissues are
unremarkable.
IMPRESSION: Negative.

## 2022-05-24 ENCOUNTER — Encounter: Payer: Self-pay | Admitting: Emergency Medicine

## 2022-05-24 ENCOUNTER — Ambulatory Visit
Admission: EM | Admit: 2022-05-24 | Discharge: 2022-05-24 | Disposition: A | Discharge status: Home / Self Care | Payer: BC Managed Care – PPO | Attending: Physician Assistant | Admitting: Physician Assistant

## 2022-05-24 ENCOUNTER — Other Ambulatory Visit: Payer: Self-pay

## 2022-05-24 DIAGNOSIS — B351 Tinea unguium: Secondary | ICD-10-CM

## 2022-05-24 MED ORDER — CICLOPIROX 8 % EX SOLN: Freq: Every day | CUTANEOUS | 0 refills | Status: DC

## 2022-05-24 NOTE — ED Triage Notes (Signed)
Patient presents to [facility] for evauluation of skin changes around his right middle finger for the past 2 months that he feels is now spreading to other fingers

## 2022-05-24 NOTE — ED Provider Notes (Signed)
[facility]    CSN: [id number] Arrival date & time: 05/24/22  1020      History   Chief Complaint Chief Complaint  Patient presents with   Rash    HPI Jesus Zuniga is a 31 y.o. male.   Patient here today for evaluation of fingernail changes to his right middle nail that is been ongoing for the last several months.  He reports that now he is starting to notice some changes to nails on his left hand as well.  He denies any pain associated with symptoms.  He has not had any fever or other concerns.  The history is provided by the patient.    Past Medical History:  Diagnosis Date   Gout    Morbid obesity [facility])     Patient Active Problem List   Diagnosis Date Noted   Acute idiopathic gout of left foot 12/18/2018    History reviewed. No pertinent surgical history.     Home Medications    Prior to Admission medications   Medication Sig Start Date End Date Taking? Authorizing Provider  ciclopirox (PENLAC) 8 % solution Apply topically at bedtime. Apply over nail and surrounding skin. Apply daily over previous coat. After seven (7) days, may remove with alcohol and continue cycle. 05/24/22  Yes Tomi Bamberger, PA-C  ibuprofen (ADVIL) 600 MG tablet Take 1 tablet (600 mg total) by mouth every 8 (eight) hours as needed. 03/25/20   Merrilee Jansky, MD  colchicine 0.6 MG tablet Take two pills on the first day, and then one pill bid until symptoms resolve 12/18/18 03/25/20  Cristie Hem, PA-C    Family History Family History  Problem Relation Age of Onset   Healthy Mother    Diabetes Father     Social History Social History   Tobacco Use   Smoking status: Never   Smokeless tobacco: Never  Substance Use Topics   Alcohol use: No   Drug use: No     Allergies   Patient has no known allergies.   Review of Systems Review of Systems  Constitutional:  Negative for chills and fever.  Eyes:  Negative for discharge and redness.  Skin:  Negative  for color change and rash.  Neurological:  Negative for numbness.     Physical Exam Triage Vital Signs ED Triage Vitals  Enc Vitals Group     BP      Pulse      Resp      Temp      Temp src      SpO2      Weight      Height      Head Circumference      Peak Flow      Pain Score      Pain Loc      Pain Edu?      Excl. in [place]?    No data found.  Updated Vital Signs BP 138/82 (BP Location: Left Wrist)   Pulse 78   Temp 98.3 F (36.8 C) (Oral)   Resp 18   SpO2 95%      Physical Exam Vitals and nursing note reviewed.  Constitutional:      General: He is not in acute distress.    Appearance: Normal appearance. He is not ill-appearing.  HENT:     Head: Normocephalic and atraumatic.  Eyes:     Conjunctiva/sclera: Conjunctivae normal.  Cardiovascular:     Rate and Rhythm: Normal rate.  Pulmonary:     Effort: Pulmonary effort is normal.  Skin:    Comments: Thickening to right middle nail with minimal splintering noted  Neurological:     Mental Status: He is alert.  Psychiatric:        Mood and Affect: Mood normal.        Behavior: Behavior normal.        Thought Content: Thought content normal.      UC Treatments / Results  Labs (all labs ordered are listed, but only abnormal results are displayed) Labs Reviewed - No data to display  EKG   Radiology No results found.  Procedures Procedures (including critical care time)  Medications Ordered in UC Medications - No data to display  Initial Impression / Assessment and Plan / UC Course  I have reviewed the triage vital signs and the nursing notes.  Pertinent labs & imaging results that were available during my care of the patient were reviewed by me and considered in my medical decision making (see chart for details).    Topical antifungal prescribed- discussed that he may need oral antifungal and that this would need to be prescribed by PCP for monitoring. Patient expresses understanding.  Encouraged follow up with any further concerns.   Final Clinical Impressions(s) / UC Diagnoses   Final diagnoses:  Onychomycosis   Discharge Instructions   None    ED Prescriptions     Medication Sig Dispense Auth. Provider   ciclopirox (PENLAC) 8 % solution Apply topically at bedtime. Apply over nail and surrounding skin. Apply daily over previous coat. After seven (7) days, may remove with alcohol and continue cycle. 6.6 mL Tomi Bamberger, PA-C      PDMP not reviewed this encounter.   Tomi Bamberger, PA-C 05/24/22 1131

## 2022-05-30 ENCOUNTER — Ambulatory Visit
Admission: EM | Admit: 2022-05-30 | Discharge: 2022-05-30 | Disposition: A | Discharge status: Home / Self Care | Payer: BC Managed Care – PPO | Attending: Internal Medicine | Admitting: Internal Medicine

## 2022-05-30 ENCOUNTER — Encounter: Payer: Self-pay | Admitting: *Deleted

## 2022-05-30 DIAGNOSIS — M25571 Pain in right ankle and joints of right foot: Secondary | ICD-10-CM | POA: Diagnosis not present

## 2022-05-30 MED ORDER — PREDNISONE 20 MG PO TABS: 40.0000 mg | ORAL_TABLET | Freq: Every day | ORAL | 0 refills | Status: AC

## 2022-05-30 NOTE — ED Triage Notes (Signed)
Pt states feels like he's having a gout flare-up in right ankle onset 2 days ago. Has been taking Advil.

## 2022-05-30 NOTE — ED Provider Notes (Signed)
[facility]    CSN: [id number] Arrival date & time: 05/30/22  1439      History   Chief Complaint Chief Complaint  Patient presents with   Ankle Pain    HPI Chaim Gatley is a 31 y.o. male.   Patient presents with right ankle pain that started about 2 days ago.  Denies any obvious injury to the area.  Patient reports that he has a history of gout in his left ankle and reports that this feels similar.  He has never had gout in the right ankle.  He states that he ate sushi prior to symptoms starting and thinks this may have exacerbated it.  He has taken Advil with minimal improvement.  Denies any numbness or tingling.  Patient is able to bear weight but with pain.   Ankle Pain   Past Medical History:  Diagnosis Date   Gout    Morbid obesity [facility])     Patient Active Problem List   Diagnosis Date Noted   Acute idiopathic gout of left foot 12/18/2018    Past Surgical History:  Procedure Laterality Date   NO PAST SURGERIES         Home Medications    Prior to Admission medications   Medication Sig Start Date End Date Taking? Authorizing Provider  predniSONE (DELTASONE) 20 MG tablet Take 2 tablets (40 mg total) by mouth daily for 5 days. 05/30/22 06/04/22 Yes Heavenlee Maiorana, Acie Fredrickson, FNP  ciclopirox (PENLAC) 8 % solution Apply topically at bedtime. Apply over nail and surrounding skin. Apply daily over previous coat. After seven (7) days, may remove with alcohol and continue cycle. 05/24/22   Tomi Bamberger, PA-C  ibuprofen (ADVIL) 600 MG tablet Take 1 tablet (600 mg total) by mouth every 8 (eight) hours as needed. 03/25/20   Merrilee Jansky, MD  colchicine 0.6 MG tablet Take two pills on the first day, and then one pill bid until symptoms resolve 12/18/18 03/25/20  Cristie Hem, PA-C    Family History Family History  Problem Relation Age of Onset   Healthy Mother    Diabetes Father     Social History Social History   Tobacco Use   Smoking status:  Never   Smokeless tobacco: Never  Vaping Use   Vaping Use: Never used  Substance Use Topics   Alcohol use: No   Drug use: No     Allergies   Patient has no known allergies.   Review of Systems Review of Systems Per HPI  Physical Exam Triage Vital Signs ED Triage Vitals  Enc Vitals Group     BP 05/30/22 1704 122/80     Pulse Rate 05/30/22 1704 76     Resp 05/30/22 1704 20     Temp 05/30/22 1704 98 F (36.7 C)     Temp Source 05/30/22 1704 Oral     SpO2 05/30/22 1704 98 %     Weight --      Height --      Head Circumference --      Peak Flow --      Pain Score 05/30/22 1705 4     Pain Loc --      Pain Edu? --      Excl. in [place]? --    No data found.  Updated Vital Signs BP 122/80   Pulse 76   Temp 98 F (36.7 C) (Oral)   Resp 20   SpO2 98%   Visual Acuity  Right Eye Distance:   Left Eye Distance:   Bilateral Distance:    Right Eye Near:   Left Eye Near:    Bilateral Near:     Physical Exam Constitutional:      General: He is not in acute distress.    Appearance: Normal appearance. He is not toxic-appearing or diaphoretic.  HENT:     Head: Normocephalic and atraumatic.  Eyes:     Extraocular Movements: Extraocular movements intact.     Conjunctiva/sclera: Conjunctivae normal.  Pulmonary:     Effort: Pulmonary effort is normal.  Musculoskeletal:     Comments: Tenderness to palpation to lateral and medial right ankle with circumferential mild swelling.  No tenderness to foot.  He does have mild tenderness throughout right great toe.  Capillary refill and pulses intact.  No discoloration, warmth, lacerations, abrasions noted.  Neurological:     General: No focal deficit present.     Mental Status: He is alert and oriented to person, place, and time. Mental status is at baseline.  Psychiatric:        Mood and Affect: Mood normal.        Behavior: Behavior normal.        Thought Content: Thought content normal.        Judgment: Judgment normal.       UC Treatments / Results  Labs (all labs ordered are listed, but only abnormal results are displayed) Labs Reviewed - No data to display  EKG   Radiology No results found.  Procedures Procedures (including critical care time)  Medications Ordered in UC Medications - No data to display  Initial Impression / Assessment and Plan / UC Course  I have reviewed the triage vital signs and the nursing notes.  Pertinent labs & imaging results that were available during my care of the patient were reviewed by me and considered in my medical decision making (see chart for details).     Given no obvious injury I do not think that imaging is necessary.  Patient has history of gout and states that this "feels similar".  Therefore, will opt to treat with prednisone as patient has taken this before with resolution of gout.  Advised supportive care and elevation of extremity as well.  Patient to follow-up if symptoms persist or worsen.  Patient verbalized understanding and was agreeable with plan. Final Clinical Impressions(s) / UC Diagnoses   Final diagnoses:  Acute right ankle pain     Discharge Instructions      You have been prescribed prednisone to help alleviate your ankle pain.  Please follow-up if symptoms persist or worsen.    ED Prescriptions     Medication Sig Dispense Auth. Provider   predniSONE (DELTASONE) 20 MG tablet Take 2 tablets (40 mg total) by mouth daily for 5 days. 10 tablet Gustavus Bryant, [place]      PDMP not reviewed this encounter.   Gustavus Bryant, [place] 05/30/22 [phone]

## 2022-05-30 NOTE — Discharge Instructions (Signed)
You have been prescribed prednisone to help alleviate your ankle pain.  Please follow-up if symptoms persist or worsen.

## 2022-06-06 ENCOUNTER — Ambulatory Visit
Admission: EM | Admit: 2022-06-06 | Discharge: 2022-06-06 | Disposition: A | Discharge status: Home / Self Care | Payer: BC Managed Care – PPO | Attending: Physician Assistant | Admitting: Physician Assistant

## 2022-06-06 DIAGNOSIS — M109 Gout, unspecified: Secondary | ICD-10-CM | POA: Diagnosis not present

## 2022-06-06 MED ORDER — PREDNISONE 20 MG PO TABS: 40.0000 mg | ORAL_TABLET | Freq: Every day | ORAL | 0 refills | Status: AC

## 2022-06-06 NOTE — ED Triage Notes (Signed)
Pt presents with ongoing right foot pain associated with gout that has been partially relieved with prednisone.

## 2022-06-06 NOTE — ED Provider Notes (Signed)
[facility]    CSN: [id number] Arrival date & time: 06/06/22  0947      History   Chief Complaint Chief Complaint  Patient presents with   Gout    HPI Jesus Zuniga is a 31 y.o. male.   Patient here today for continued right foot pain associated with gout since recently being treated with prednisone.  He reports that pain is almost gone but has not completely resolved.  He now has most pain at first MTP.  Weightbearing makes pain worse.  He denies any numbness or tingling.  The history is provided by the patient.    Past Medical History:  Diagnosis Date   Gout    Morbid obesity [facility])     Patient Active Problem List   Diagnosis Date Noted   Acute idiopathic gout of left foot 12/18/2018    Past Surgical History:  Procedure Laterality Date   NO PAST SURGERIES         Home Medications    Prior to Admission medications   Medication Sig Start Date End Date Taking? Authorizing Provider  predniSONE (DELTASONE) 20 MG tablet Take 2 tablets (40 mg total) by mouth daily with breakfast for 5 days. 06/06/22 06/11/22 Yes Tomi Bamberger, PA-C  ciclopirox [facility]) 8 % solution Apply topically at bedtime. Apply over nail and surrounding skin. Apply daily over previous coat. After seven (7) days, may remove with alcohol and continue cycle. 05/24/22   Tomi Bamberger, PA-C  ibuprofen (ADVIL) 600 MG tablet Take 1 tablet (600 mg total) by mouth every 8 (eight) hours as needed. 03/25/20   Merrilee Jansky, MD  colchicine 0.6 MG tablet Take two pills on the first day, and then one pill bid until symptoms resolve 12/18/18 03/25/20  Cristie Hem, PA-C    Family History Family History  Problem Relation Age of Onset   Healthy Mother    Diabetes Father     Social History Social History   Tobacco Use   Smoking status: Never   Smokeless tobacco: Never  Vaping Use   Vaping Use: Never used  Substance Use Topics   Alcohol use: No   Drug use: No      Allergies   Patient has no known allergies.   Review of Systems Review of Systems  Constitutional:  Negative for chills and fever.  Eyes:  Negative for discharge and redness.  Musculoskeletal:  Positive for arthralgias.  Neurological:  Negative for numbness.     Physical Exam Triage Vital Signs ED Triage Vitals  Enc Vitals Group     BP      Pulse      Resp      Temp      Temp src      SpO2      Weight      Height      Head Circumference      Peak Flow      Pain Score      Pain Loc      Pain Edu?      Excl. in GC?    No data found.  Updated Vital Signs BP (!) 152/86 (BP Location: Left Arm)   Pulse 75   Temp 97.9 F (36.6 C) (Oral)   Resp 18   SpO2 95%       Physical Exam Vitals and nursing note reviewed.  Constitutional:      General: He is not in acute distress.    Appearance:  Normal appearance. He is not ill-appearing.  HENT:     Head: Normocephalic and atraumatic.  Eyes:     Conjunctiva/sclera: Conjunctivae normal.  Cardiovascular:     Rate and Rhythm: Normal rate.  Pulmonary:     Effort: Pulmonary effort is normal.  Musculoskeletal:     Comments: Mild swelling to the lateral first MTP of right foot with tenderness to palpation noted to same  Neurological:     Mental Status: He is alert.     Comments: Gross sensation intact to right distal toes  Psychiatric:        Mood and Affect: Mood normal.        Behavior: Behavior normal.        Thought Content: Thought content normal.      UC Treatments / Results  Labs (all labs ordered are listed, but only abnormal results are displayed) Labs Reviewed - No data to display  EKG   Radiology No results found.  Procedures Procedures (including critical care time)  Medications Ordered in UC Medications - No data to display  Initial Impression / Assessment and Plan / UC Course  I have reviewed the triage vital signs and the nursing notes.  Pertinent labs & imaging results that were  available during my care of the patient were reviewed by me and considered in my medical decision making (see chart for details).    Additional steroid burst prescribed for any symptoms.  Recommended further evaluation if symptoms do not clear or with any further concerns.  Final Clinical Impressions(s) / UC Diagnoses   Final diagnoses:  Acute gout of right foot, unspecified cause   Discharge Instructions   None    ED Prescriptions     Medication Sig Dispense Auth. Provider   predniSONE (DELTASONE) 20 MG tablet Take 2 tablets (40 mg total) by mouth daily with breakfast for 5 days. 10 tablet Tomi Bamberger, PA-C      PDMP not reviewed this encounter.   Tomi Bamberger, PA-C 06/06/22 1213

## 2022-07-25 ENCOUNTER — Other Ambulatory Visit: Payer: Self-pay | Admitting: Nurse Practitioner

## 2022-07-25 ENCOUNTER — Ambulatory Visit
Admission: RE | Admit: 2022-07-25 | Discharge: 2022-07-25 | Disposition: A | Discharge status: Home / Self Care | Payer: BC Managed Care – PPO | Source: Ambulatory Visit | Attending: Nurse Practitioner | Admitting: Nurse Practitioner

## 2022-07-25 DIAGNOSIS — M7989 Other specified soft tissue disorders: Secondary | ICD-10-CM

## 2022-08-17 ENCOUNTER — Ambulatory Visit (INDEPENDENT_AMBULATORY_CARE_PROVIDER_SITE_OTHER): Payer: BC Managed Care – PPO | Admitting: Podiatry

## 2022-08-17 ENCOUNTER — Encounter: Payer: Self-pay | Admitting: Podiatry

## 2022-08-17 ENCOUNTER — Ambulatory Visit (INDEPENDENT_AMBULATORY_CARE_PROVIDER_SITE_OTHER): Payer: BC Managed Care – PPO

## 2022-08-17 DIAGNOSIS — M109 Gout, unspecified: Secondary | ICD-10-CM | POA: Diagnosis not present

## 2022-08-17 DIAGNOSIS — M7672 Peroneal tendinitis, left leg: Secondary | ICD-10-CM

## 2022-08-17 DIAGNOSIS — M7671 Peroneal tendinitis, right leg: Secondary | ICD-10-CM

## 2022-08-17 MED ORDER — TRIAMCINOLONE ACETONIDE 10 MG/ML IJ SUSP
10.0000 mg | Freq: Once | INTRAMUSCULAR | Status: AC
  Administered 2022-08-17: 10 mg

## 2022-08-17 MED ORDER — PREDNISONE 10 MG PO TABS: ORAL_TABLET | ORAL | 0 refills | Status: DC

## 2022-08-17 NOTE — Patient Instructions (Signed)
Gout  Gout is painful swelling of your joints. Gout is a type of arthritis. It is caused by having too much uric acid in your body. Uric acid is a chemical that is made when your body breaks down substances called purines. If your body has too much uric acid, sharp crystals can form and build up in your joints. This causes pain and swelling. Gout attacks can happen quickly and be very painful (acute gout). Over time, the attacks can affect more joints and happen more often (chronic gout). What are the causes? Gout is caused by too much uric acid in your blood. This can happen because: Your kidneys do not remove enough uric acid from your blood. Your body makes too much uric acid. You eat too many foods that are high in purines. These foods include organ meats, some seafood, and beer. Trauma or stress can bring on an attack. What increases the risk? Having a family history of gout. Being male and middle-aged. Being male and having gone through menopause. Having an organ transplant. Taking certain medicines. Having certain conditions, such as: Being very overweight (obese). Lead poisoning. Kidney disease. A skin condition called psoriasis. Other risks include: Losing weight too quickly. Not having enough water in the body (being dehydrated). Drinking alcohol, especially beer. Drinking beverages that are sweetened with a type of sugar called fructose. What are the signs or symptoms? An attack of acute gout often starts at night and usually happens in just one joint. The most common place is the big toe. Other joints that may be affected include joints of the feet, ankle, knee, fingers, wrist, or elbow. Symptoms may include: Very bad pain. Warmth. Swelling. Stiffness. Tenderness. The affected joint may be very painful to touch. Shiny, red, or purple skin. Chills and fever. Chronic gout may cause symptoms more often. More joints may be involved. You may also have white or yellow lumps  (tophi) on your hands or feet or in other areas near your joints. How is this treated? Treatment for an acute attack may include medicines for pain and swelling, such as: NSAIDs, such as ibuprofen. Steroids taken by mouth or injected into a joint. Colchicine. This can be given by mouth or through an IV tube. Treatment to prevent future attacks may include: Taking small doses of NSAIDs or colchicine daily. Using a medicine that reduces uric acid levels in your blood, such as allopurinol. Making changes to your diet. You may need to see a food expert (dietitian) about what to eat and drink to prevent gout. Follow these instructions at home: During a gout attack  If told, put ice on the painful area. To do this: Put ice in a plastic bag. Place a towel between your skin and the bag. Leave the ice on for 20 minutes, 2-3 times a day. Take off the ice if your skin turns bright red. This is very important. If you cannot feel pain, heat, or cold, you have a greater risk of damage to the area. Raise the painful joint above the level of your heart as often as you can. Rest the joint as much as possible. If the joint is in your leg, you may be given crutches. Follow instructions from your doctor about what you cannot eat or drink. Avoiding future gout attacks Eat a low-purine diet. Avoid foods and drinks such as: Liver. Kidney. Anchovies. Asparagus. Herring. Mushrooms. Mussels. Beer. Stay at a healthy weight. If you want to lose weight, talk with your doctor. Do not   lose weight too fast. Start or continue an exercise plan as told by your doctor. Eating and drinking Avoid drinks sweetened by fructose. Drink enough fluids to keep your pee (urine) pale yellow. If you drink alcohol: Limit how much you have to: 0-1 drink a day for women who are not pregnant. 0-2 drinks a day for men. Know how much alcohol is in a drink. In the [place] one drink equals one 12 oz bottle of beer (355 mL), one 5 oz  glass of wine (148 mL), or one 1 oz glass of hard liquor (44 mL). General instructions Take over-the-counter and prescription medicines only as told by your doctor. Ask your doctor if you should avoid driving or using machines while you are taking your medicine. Return to your normal activities when your doctor says that it is safe. Keep all follow-up visits. Where to find more information National Institutes of Health: www.niams.nih.gov Contact a doctor if: You have another gout attack. You still have symptoms of a gout attack after 10 days of treatment. You have problems (side effects) because of your medicines. You have chills or a fever. You have burning pain when you pee (urinate). You have pain in your lower back or belly. Get help right away if: You have very bad pain. Your pain cannot be controlled. You cannot pee. Summary Gout is painful swelling of the joints. The most common site of pain is the big toe, but it can affect other joints. Medicines and avoiding some foods can help to prevent and treat gout attacks. This information is not intended to replace advice given to you by your health care provider. Make sure you discuss any questions you have with your health care provider. Document Revised: 07/14/2021 Document Reviewed: 07/14/2021 Elsevier Patient Education  2023 Elsevier Inc.  

## 2022-08-17 NOTE — Progress Notes (Signed)
Subjective:   Patient ID: Jesus Zuniga, male   DOB: 31 y.o.   MRN: [id number]   HPI Patient presents with significant gout-like symptomatology left over right stating that he has had a lot of different attacks but the ones over the last month have been awful with the left being first the right second.  He has extreme obesity which is complicating factor does not smoke does not drink and would like to be active but cannot be   Review of Systems  All other systems reviewed and are negative.       Objective:  Physical Exam Vitals and nursing note reviewed.  Constitutional:      Appearance: He is well-developed.  Pulmonary:     Effort: Pulmonary effort is normal.  Musculoskeletal:        General: Normal range of motion.  Skin:    General: Skin is warm.  Neurological:     Mental Status: He is alert.     Neurovascular status intact muscle strength found to be within normal limits with patient having mild edema in his feet secondary to his obesity severe discomfort of the peroneal insertion fifth metatarsal right over left with gout as probable precipitating factor along with obesity     Assessment:  Very difficult condition with patient being obese which is complicating all treatment with inflammation lateral foot bilateral around the peroneal insertion     Plan:  8 NP reviewed condition at great length and I want him to bring his blood work into me to see.  At this point I due to the intensity of his discomfort I did do sterile prep injected the peroneal tendon near insertion 3 mg Dexasone Kenalog 5 g Xylocaine bilateral and then placed on a Sterapred DS 12-day Dosepak and we will see him back in a week and discussed medications to help control his gout better with consideration of allopurinol colchicine.  Also gave him sheets on weight loss and gout medication that I think he needs to do it did discussed at great length with him weight loss  X-rays indicate there is some  swelling in his feet generalized with obesity is complicating factor and probability for gout

## 2022-08-22 DIAGNOSIS — M79676 Pain in unspecified toe(s): Secondary | ICD-10-CM

## 2022-08-23 ENCOUNTER — Telehealth: Payer: Self-pay | Admitting: Podiatry

## 2022-08-23 NOTE — Telephone Encounter (Signed)
Mr. Garden has presented me with Disability paperwork. He stated he has been out of work since 07/19/2022 due to his foot. His first visit at our office was 10/25/223, which is the day he brought in the paperwork. What day should I begin his leave on? How long will this patient be out of work.

## 2022-08-24 ENCOUNTER — Ambulatory Visit (INDEPENDENT_AMBULATORY_CARE_PROVIDER_SITE_OTHER): Payer: BC Managed Care – PPO | Admitting: Podiatry

## 2022-08-24 ENCOUNTER — Encounter: Payer: Self-pay | Admitting: Podiatry

## 2022-08-24 DIAGNOSIS — M109 Gout, unspecified: Secondary | ICD-10-CM | POA: Diagnosis not present

## 2022-08-24 DIAGNOSIS — M7672 Peroneal tendinitis, left leg: Secondary | ICD-10-CM

## 2022-08-24 DIAGNOSIS — M7671 Peroneal tendinitis, right leg: Secondary | ICD-10-CM

## 2022-08-24 MED ORDER — ALLOPURINOL 100 MG PO TABS: 100.0000 mg | ORAL_TABLET | Freq: Every day | ORAL | 6 refills | Status: DC

## 2022-08-24 MED ORDER — COLCHICINE 0.6 MG PO TABS: 0.6000 mg | ORAL_TABLET | Freq: Two times a day (BID) | ORAL | 2 refills | Status: AC

## 2022-08-24 NOTE — Progress Notes (Signed)
Subjective:   Patient ID: Jesus Zuniga, male   DOB: 31 y.o.   MRN: [id number]   HPI Patient states he is improved and states that his uric acid level was 11.7   ROS      Objective:  Physical Exam  Neurovascular status intact with patient who does have extreme obesity and uric acid right of 11.7 that indicates there is systemic condition here     Assessment:  His feet are feeling better I am concerned about his extreme obesity and elevated uric acid     Plan:  Reviewed at great length with him the importance of weight loss and we discussed that at great length and I went ahead today and I do recommend the beginning of allopurinol and also colchicine as needed and I gave him instructions on foods to avoid and patient will take the rest of this week off return to work will be seen back as needed and hopefully we can stop the gout attacks and he promises me that he is good to start on a weight loss program

## 2022-09-18 ENCOUNTER — Emergency Department (HOSPITAL_COMMUNITY): Payer: BC Managed Care – PPO

## 2022-09-18 ENCOUNTER — Other Ambulatory Visit: Payer: Self-pay

## 2022-09-18 ENCOUNTER — Emergency Department (HOSPITAL_COMMUNITY)
Admission: EM | Admit: 2022-09-18 | Discharge: 2022-09-18 | Disposition: A | Discharge status: Home / Self Care | Payer: BC Managed Care – PPO | Attending: Emergency Medicine | Admitting: Emergency Medicine

## 2022-09-18 DIAGNOSIS — M778 Other enthesopathies, not elsewhere classified: Secondary | ICD-10-CM

## 2022-09-18 DIAGNOSIS — Y9389 Activity, other specified: Secondary | ICD-10-CM | POA: Insufficient documentation

## 2022-09-18 DIAGNOSIS — M25521 Pain in right elbow: Secondary | ICD-10-CM | POA: Diagnosis not present

## 2022-09-18 DIAGNOSIS — M70831 Other soft tissue disorders related to use, overuse and pressure, right forearm: Secondary | ICD-10-CM | POA: Diagnosis not present

## 2022-09-18 MED ORDER — HYDROCODONE-ACETAMINOPHEN 5-325 MG PO TABS: 1.0000 | ORAL_TABLET | Freq: Three times a day (TID) | ORAL | 0 refills | Status: AC | PRN

## 2022-09-18 MED ORDER — IBUPROFEN 600 MG PO TABS: 600.0000 mg | ORAL_TABLET | Freq: Four times a day (QID) | ORAL | 0 refills | Status: DC

## 2022-09-18 MED ORDER — HYDROCODONE-ACETAMINOPHEN 5-325 MG PO TABS
1.0000 | ORAL_TABLET | Freq: Once | ORAL | Status: AC
  Administered 2022-09-18: 1 via ORAL
  Filled 2022-09-18: qty 1

## 2022-09-18 NOTE — Discharge Instructions (Addendum)
You were seen in the ER for elbow pain.  It appears that most likely you have elbow tendinitis.  Apply ice to the area of discomfort 4 times a day for 15 minutes. Take the medications that are prescribed for pain control.  Take ibuprofen every 6 hours, the stronger narcotic medicine should be taken only if needed.  Call the orthopedist at the number provided. Return to the emergency room if you start having increased swelling, increased pain, fevers, chills, difficulty moving the elbow altogether.

## 2022-09-18 NOTE — ED Triage Notes (Signed)
Reports right arm / elbow area was painful yesterday, got much worse today. Took Advil around 0930 today. Hx of gout, usually in toe. Takes allopurinol, no red meat or ETOH. Denies trauma or falls.

## 2022-09-18 NOTE — ED Provider Notes (Signed)
[facility]-EMERGENCY DEPT Provider Note   CSN: [id number] Arrival date & time: 09/18/22  1031     History  No chief complaint on file.   Jesus Zuniga is a 31 y.o. male.  HPI    31 year old patient comes in with chief complaint of elbow pain. Patient has history of gout. Patient states that he started having pain over his elbow yesterday, the pain is worsened over time.  Pain is right-sided.  Patient is left-handed.  Patient has COVID, but the symptoms typically has only affected his lower extremity.  Patient has no nausea, vomiting, fevers, chills.  Patient does not have any diabetes.  He is not immunosuppressed.  Home Medications Prior to Admission medications   Medication Sig Start Date End Date Taking? Authorizing Provider  HYDROcodone-acetaminophen (NORCO/VICODIN) 5-325 MG tablet Take 1 tablet by mouth every 8 (eight) hours as needed for up to 3 days for severe pain. 09/18/22 09/21/22 Yes Derwood Kaplan, MD  ibuprofen (ADVIL) 600 MG tablet Take 1 tablet (600 mg total) by mouth 4 (four) times daily. 09/18/22  Yes Derwood Kaplan, MD  allopurinol (ZYLOPRIM) 100 MG tablet Take 1 tablet (100 mg total) by mouth daily. 08/24/22   Lenn Sink, DPM  colchicine 0.6 MG tablet Take 1 tablet (0.6 mg total) by mouth 2 (two) times daily. Take one twice a day if having acute gout flare up 08/24/22   Lenn Sink, DPM  predniSONE (DELTASONE) 10 MG tablet 12 day tapering dose 08/17/22   Lenn Sink, DPM      Allergies    Patient has no known allergies.    Review of Systems   Review of Systems  Physical Exam Updated Vital Signs BP (!) 158/93 (BP Location: Left Arm)   Pulse 80   Temp 98.4 F (36.9 C) (Oral)   Resp 18   Ht 5\' 11"  (1.803 m)   Wt (!) 204 kg   SpO2 97%   BMI 62.73 kg/m  Physical Exam Vitals and nursing note reviewed.  Constitutional:      Appearance: He is well-developed.  HENT:     Head: Atraumatic.  Cardiovascular:      Rate and Rhythm: Normal rate.  Pulmonary:     Effort: Pulmonary effort is normal.  Musculoskeletal:        General: Swelling and tenderness present.     Cervical back: Neck supple.     Comments: Slightly edematous right elbow over the lateral aspect.  Patient has no rubor, color.  Patient able to flex, extend, pronate and supinate, with complaints of worsening tenderness with flexion over extension and supination or pronation.   Skin:    General: Skin is warm.  Neurological:     Mental Status: He is alert and oriented to person, place, and time.     ED Results / Procedures / Treatments   Labs (all labs ordered are listed, but only abnormal results are displayed) Labs Reviewed - No data to display  EKG None  Radiology DG Elbow Complete Right  Result Date: 09/18/2022 CLINICAL DATA:  Acute RIGHT elbow pain. No known injury. Initial encounter. EXAM: RIGHT ELBOW - COMPLETE 4 VIEW COMPARISON:  None Available. FINDINGS: There is no evidence of acute fracture, subluxation or dislocation. No joint effusion is noted. Moderate ulnar osteophyte at the medial humeral ulnar joint noted. No erosive changes are noted. IMPRESSION: 1. No acute abnormality. 2. Moderate ulnar osteophyte/degenerative change at the medial humeral ulnar joint. Electronically Signed   By:  Harmon Pier M.D.   On: 09/18/2022 11:51    Procedures Procedures    Medications Ordered in ED Medications  HYDROcodone-acetaminophen (NORCO/VICODIN) 5-325 MG per tablet 1 tablet (1 tablet Oral Given 09/18/22 1116)    ED Course/ Medical Decision Making/ A&P                           Medical Decision Making Risk Prescription drug management.  31 year old patient came in with chief complaint of elbow pain.  Patient states that his elbow pain started yesterday, and has worsened over time.  History of gout, but that has affected his lower extremity only.  Patient does not drink heavy, does not consume red meat.  Differential  diagnosis considered includes gout, septic arthritis, tendinitis, contusion, cellulitis, upper extremity DVT.  Patient has no risk factors for DVT, extremely low pretest probability for it.  I do not think we need to order D-dimer at this time.  On exam patient has musculoskeletal pain.  Distal pulses normal.  He does not have any evidence of compartment syndrome.  Patient able to move over the elbow joint pretty well, therefore I doubt that this is gouty arthritis or septic arthritis.  Likely tendinitis. Will treat with RICE.  Return precautions discussed.  Orthopedic service follow-up provided.   Final Clinical Impression(s) / ED Diagnoses Final diagnoses:  Right elbow tendinitis    Rx / DC Orders ED Discharge Orders          Ordered    ibuprofen (ADVIL) 600 MG tablet  4 times daily        09/18/22 1401    HYDROcodone-acetaminophen (NORCO/VICODIN) 5-325 MG tablet  Every 8 hours PRN        09/18/22 1401              Derwood Kaplan, MD 09/18/22 2118

## 2022-09-18 NOTE — ED Provider Triage Note (Signed)
Emergency Medicine Provider Triage Evaluation Note  Norval Slaven , a 31 y.o. male  was evaluated in triage.  Pt complains of right elbow pain which began yesterday however exacerbated today.  Worsening pain with any extension and flexion.  Prior history of gout however this usually presents in his feet.  Did take Advil with some improvement in symptoms.  No fever, no trauma..  Review of Systems  Positive: Right elbow pain Negative: Fever, trauma  Physical Exam  BP (!) 158/93 (BP Location: Left Arm)   Pulse 80   Temp 98.4 F (36.9 C) (Oral)   Resp 18   Ht 5\' 11"  (1.803 m)   Wt (!) 204 kg   SpO2 97%   BMI 62.73 kg/m  Gen:   Awake, no distress   Resp:  Normal effort  MSK:   Moves extremities without difficulty  Other:  Limited range of motion due to pain with flexion and extension.  Pulses present, capillary refills intact with good strength.  Medical Decision Making  Medically screening exam initiated at 11:06 AM.  Appropriate orders placed.  Jalan Aguilar-Varela was informed that the remainder of the evaluation will be completed by another provider, this initial triage assessment does not replace that evaluation, and the importance of remaining in the ED until their evaluation is complete.     Harvie Heck, PA-C 09/18/22 1108

## 2022-09-22 ENCOUNTER — Encounter: Payer: Self-pay | Admitting: Physician Assistant

## 2022-09-22 ENCOUNTER — Ambulatory Visit: Payer: BC Managed Care – PPO | Admitting: Physician Assistant

## 2022-09-22 ENCOUNTER — Ambulatory Visit: Payer: Self-pay

## 2022-09-22 DIAGNOSIS — M25521 Pain in right elbow: Secondary | ICD-10-CM | POA: Diagnosis not present

## 2022-09-22 DIAGNOSIS — M1A0210 Idiopathic chronic gout, right elbow, without tophus (tophi): Secondary | ICD-10-CM | POA: Diagnosis not present

## 2022-09-22 MED ORDER — METHYLPREDNISOLONE ACETATE 40 MG/ML IJ SUSP
40.0000 mg | INTRAMUSCULAR | Status: AC | PRN
  Administered 2022-09-22: 40 mg via INTRA_ARTICULAR

## 2022-09-22 MED ORDER — LIDOCAINE HCL 1 % IJ SOLN
2.0000 mL | INTRAMUSCULAR | Status: AC | PRN
  Administered 2022-09-22: 2 mL

## 2022-09-22 MED ORDER — BUPIVACAINE HCL 0.25 % IJ SOLN
1.0000 mL | INTRAMUSCULAR | Status: AC | PRN
  Administered 2022-09-22: 1 mL via INTRA_ARTICULAR

## 2022-09-22 NOTE — Progress Notes (Signed)
Office Visit Note   Patient: Jesus Zuniga           Date of Birth: [birth year]           MRN: [id number] Visit Date: 09/22/2022              Requested by: No referring provider defined for this encounter. PCP: Patient, No Pcp Per   Assessment & Plan: Visit Diagnoses:  1. Pain in right elbow   2. 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  Does have a history of gout.  Takes allopurinol.  Most recently reported uric acid is 11.7 he also is being seen by podiatry for foot pain and is using a cane today.  Denies any fever or chills.  Pain hurts with range of motion of his elbow.  Exam today was consistent with elbow joint pathology rather than medial lateral epicondylitis.  Does have a history of gout though his arm does not have any erythema or swelling.  He does have good motion but it is very painful.  This includes extension flexion supination and pronation.  He is neurovascularly intact distally.  I did refer him for an ultrasound-guided injection with Dr. Shon Baton.  This was done successfully  Follow-Up Instructions: Return if symptoms worsen or fail to improve.   Orders:  Orders Placed This Encounter  Procedures   Medium Joint Inj: R elbow   US Guided Needle Placement - No Linked Charges   No orders of the defined types were placed in this encounter.     Procedures: No procedures performed   Clinical Data: No additional findings.   Subjective: Chief Complaint  Patient presents with   Right Elbow - Pain    HPI pleasant 31 year old gentleman presents with a chief complaint of right elbow pain.  No particular injury no fever no chills  Review of Systems  All other systems reviewed and are negative.    Objective: Vital Signs: There were no vitals taken for this visit.  Physical Exam Constitutional:      Appearance: Normal  appearance.  Skin:    General: Skin is warm and dry.  Neurological:     General: No focal deficit present.     Mental Status: He is alert.     Ortho Exam Right elbow no erythema no swelling no particular tenderness over the medial lateral epicondyles.  He has good strength with resisted extension and flexion of his arm though extension is quite painful.  Distal pulses are intact grip strength is intact pain with supination and pronation focally tender in the joint Specialty Comments:  No specialty comments available.  Imaging: No results found.   PMFS History: Patient Active Problem List   Diagnosis Date Noted   Acute idiopathic gout of left foot 12/18/2018   Past Medical History:  Diagnosis Date   Gout    Morbid obesity (HCC)     Family History  Problem Relation Age of Onset   Healthy Mother    Diabetes Father     Past Surgical History:  Procedure Laterality Date   NO PAST SURGERIES     Social History   Occupational History   Not on file  Tobacco Use   Smoking status: Never   Smokeless tobacco: Never  Vaping Use   Vaping Use: Never used  Substance and Sexual Activity   Alcohol  use: No   Drug use: No   Sexual activity: Not on file

## 2022-09-22 NOTE — Patient Instructions (Signed)
May follow-up with family medicine:  [facility] [place] Get online care: [email or web address] Address: [address] [place], [place], [place] [address] Open ? Closes 5?PM Phone: [phone]

## 2022-09-22 NOTE — Progress Notes (Signed)
   Procedure Note  Patient: Jesus Zuniga             Date of Birth: 04-14-1991           MRN: [id number]             Visit Date: 09/22/2022  Procedures: Visit Diagnoses:  1. Pain in right elbow   2. 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 A timeout was then performed. The patient was positioned in the supine position with the elbow flexed to 90 degrees. The elbow was prepped with betadine and alcohol swabs. Utilizing ultrasound guidance, the posterior elbow joint was identified in a short-axis view. Using ultrasound guidance a 25-gauge, 1.5" needle with a mixture of 2:1:1 mL's of lidocaine:bupivicaine:methylprednisone was directed from a lateral to medial approach into the elbow joint via an in-plane technique. Appropriate spread of the injectate into the elbow joint was visualized. Patient tolerated procedure well without immediate complications. A band-aid was applied.  Procedure, treatment alternatives, risks and benefits explained, specific risks discussed. Consent was given by the patient. Immediately prior to procedure a time out was called to verify the correct patient, procedure, equipment, support staff and site/side marked as required. Patient was prepped and draped in the usual sterile fashion.     - I evaluated the patient about 10 minutes post-injection and they had improvement in pain and range of motion - follow-up with Mary-Anne as indicated; I am happy to see them as needed  Madelyn Brunner, DO Primary Care Sports Medicine Physician  [facility] [place] -  Orthopedics  This note was dictated using Dragon naturally speaking software and may contain errors in syntax, spelling, or content which have not been identified prior to signing this note.

## 2022-10-06 ENCOUNTER — Ambulatory Visit (HOSPITAL_BASED_OUTPATIENT_CLINIC_OR_DEPARTMENT_OTHER)
Admission: RE | Admit: 2022-10-06 | Discharge: 2022-10-06 | Disposition: A | Discharge status: Home / Self Care | Payer: BC Managed Care – PPO | Source: Ambulatory Visit | Attending: Medical | Admitting: Medical

## 2022-10-06 ENCOUNTER — Encounter: Payer: Self-pay | Admitting: Medical

## 2022-10-06 ENCOUNTER — Ambulatory Visit: Payer: BC Managed Care – PPO | Admitting: Medical

## 2022-10-06 VITALS — BP 126/66 | HR 69 | Temp 98.0°F | Resp 18 | Ht 71.0 in | Wt >= 6400 oz

## 2022-10-06 DIAGNOSIS — M25571 Pain in right ankle and joints of right foot: Secondary | ICD-10-CM | POA: Insufficient documentation

## 2022-10-06 DIAGNOSIS — M1000 Idiopathic gout, unspecified site: Secondary | ICD-10-CM

## 2022-10-06 DIAGNOSIS — M79671 Pain in right foot: Secondary | ICD-10-CM

## 2022-10-06 DIAGNOSIS — M25572 Pain in left ankle and joints of left foot: Secondary | ICD-10-CM

## 2022-10-06 DIAGNOSIS — M79672 Pain in left foot: Secondary | ICD-10-CM | POA: Diagnosis not present

## 2022-10-06 DIAGNOSIS — M7989 Other specified soft tissue disorders: Secondary | ICD-10-CM | POA: Diagnosis not present

## 2022-10-06 NOTE — Progress Notes (Signed)
Subjective:    Patient ID: Jesus Zuniga, male    DOB: 12/27/1990, 31 y.o.   MRN: [id number]  HPI  Pt in for first time.  Pt works [employer]. He operates heavy machinery. Pt states eats healthy. Nonsmoker. No alcohol. Not married.  Gout- last flare was in October. When gets gout left great toe or ankle.  Pt states he will get random feet pain that he notes usually in in morning and when cold. States pain present for years. He states pain worse in winter but can happen all year round. Pain in bottom of heals and lateral aspects of ankle. He uses advil when has pain. He state pain will resond some to advil.  Review of Systems  Constitutional:  Negative for chills, fatigue and fever.  Respiratory:  Negative for cough, chest tightness, shortness of breath and wheezing.   Cardiovascular:  Negative for chest pain and palpitations.  Gastrointestinal:  Negative for abdominal pain.  Musculoskeletal:        See hpi.  Skin:  Negative for rash.    Past Medical History:  Diagnosis Date   Gout    Morbid obesity (HCC)      Social History   Socioeconomic History   Marital status: Single    Spouse name: Not on file   Number of children: Not on file   Years of education: Not on file   Highest education level: Not on file  Occupational History   Not on file  Tobacco Use   Smoking status: Never   Smokeless tobacco: Never  Vaping Use   Vaping Use: Never used  Substance and Sexual Activity   Alcohol use: No   Drug use: No   Sexual activity: Not on file  Other Topics Concern   Not on file  Social History Narrative   Not on file   Social Determinants of Health   Financial Resource Strain: Not on file  Food Insecurity: Not on file  Transportation Needs: Not on file  Physical Activity: Not on file  Stress: Not on file  Social Connections: Not on file  Intimate Partner Violence: Not on file    Past Surgical History:  Procedure Laterality Date   NO PAST SURGERIES       Family History  Problem Relation Age of Onset   Healthy Mother    Diabetes Father     No Known Allergies  Current Outpatient Medications on File Prior to Visit  Medication Sig Dispense Refill   allopurinol (ZYLOPRIM) 100 MG tablet Take 1 tablet (100 mg total) by mouth daily. 30 tablet 6   colchicine 0.6 MG tablet Take 1 tablet (0.6 mg total) by mouth 2 (two) times daily. Take one twice a day if having acute gout flare up 60 tablet 2   No current facility-administered medications on file prior to visit.    BP 126/66   Pulse 69   Temp 98 F (36.7 C)   Resp 18   Ht 5\' 11"  (1.803 m)   Wt (!) 488 lb 6.4 oz (221.5 kg)   SpO2 92%   BMI 68.12 kg/m        Objective:   Physical Exam  General- No acute distress. Pleasant patient. Neck- Full range of motion, no jvd Lungs- Clear, even and unlabored. Heart- regular rate and rhythm. Neurologic- CNII- XII grossly intact.  Feet- some swelling in both ankles. But no pain in talofibular ligament areas where pt report pain is present. No pain on palpation  of heels      Assessment & Plan:   Patient Instructions  Bilateral heel and ankle pain usually in morning. Pain intermittent. Will get xrays to see if you have heel spurs and evaluate lateral ankle areas as well. Can continue advil for pain and can add tylenol if needed.   For gout continue current meds   Follow up in 2-3 weeks early morning wellness exam. Will  get fasting labs that day with uric acid.    Esperanza Richters, PA-C

## 2022-10-06 NOTE — Patient Instructions (Addendum)
Bilateral heel and ankle pain usually in morning. Pain intermittent. Will get xrays to see if you have heel spurs and evaluate lateral ankle areas as well. Can continue advil for pain and can add tylenol if needed.   For gout continue current meds   Follow up in 2-3 weeks early morning wellness exam. Will  get fasting labs that day with uric acid.

## 2022-10-07 NOTE — Addendum Note (Signed)
Addended by: Gwenevere Abbot on: 10/07/2022 12:51 PM   Modules accepted: Orders

## 2022-10-20 ENCOUNTER — Ambulatory Visit: Payer: BC Managed Care – PPO | Admitting: Medical

## 2022-10-20 ENCOUNTER — Ambulatory Visit (HOSPITAL_BASED_OUTPATIENT_CLINIC_OR_DEPARTMENT_OTHER)
Admission: RE | Admit: 2022-10-20 | Discharge: 2022-10-20 | Disposition: A | Discharge status: Home / Self Care | Payer: BC Managed Care – PPO | Source: Ambulatory Visit | Attending: Medical | Admitting: Medical

## 2022-10-20 ENCOUNTER — Encounter: Payer: Self-pay | Admitting: Medical

## 2022-10-20 VITALS — BP 116/62 | HR 71 | Temp 98.3°F | Ht 71.0 in | Wt >= 6400 oz

## 2022-10-20 DIAGNOSIS — M25531 Pain in right wrist: Secondary | ICD-10-CM | POA: Diagnosis not present

## 2022-10-20 DIAGNOSIS — M1000 Idiopathic gout, unspecified site: Secondary | ICD-10-CM | POA: Diagnosis not present

## 2022-10-20 LAB — COMPREHENSIVE METABOLIC PANEL
ALT: 34 U/L (ref 0–53)
AST: 21 U/L (ref 0–37)
Albumin: 3.8 g/dL (ref 3.5–5.2)
Alkaline Phosphatase: 95 U/L (ref 39–117)
BUN: 10 mg/dL (ref 6–23)
CO2: 30 mEq/L (ref 19–32)
Calcium: 9.5 mg/dL (ref 8.4–10.5)
Chloride: 104 mEq/L (ref 96–112)
Creatinine, Ser: 0.74 mg/dL (ref 0.40–1.50)
GFR: 120.92 mL/min (ref >60.00)
Glucose, Bld: 103 mg/dL — ABNORMAL HIGH (ref 70–99)
Potassium: 4.4 mEq/L (ref 3.5–5.1)
Sodium: 140 mEq/L (ref 135–145)
Total Bilirubin: 0.7 mg/dL (ref 0.2–1.2)
Total Protein: 6.9 g/dL (ref 6.0–8.3)

## 2022-10-20 LAB — URIC ACID: Uric Acid, Serum: 8.8 mg/dL — ABNORMAL HIGH (ref 4.0–7.8)

## 2022-10-20 MED ORDER — METHYLPREDNISOLONE 4 MG PO TABS: ORAL_TABLET | ORAL | 0 refills | Status: DC

## 2022-10-20 NOTE — Progress Notes (Signed)
Subjective:    Patient ID: Jesus Zuniga, male    DOB: Oct 19, 1991, 31 y.o.   MRN: [id number]  HPI Pt had some rt wrist pain last week. He states hurt to grasp/grap. He also had some rt elbow pain. He points to lateral epicondyle area. He states also one month ago got injection in lateral elbow area.   No elbow pain presenlty and rt wrist pain much improved/only residual pain now.  Pt does have history of gout.  Pt has been staying away from gout flaring foods and beverages.  Pt is rt handed. He operate bulldozer and other heavy equipement. He goes back to work on Tuesday.    Review of Systems  Respiratory:  Negative for cough, chest tightness and shortness of breath.   Cardiovascular:  Negative for chest pain and palpitations.  Gastrointestinal:  Negative for abdominal pain.  Musculoskeletal:  Negative for back pain, myalgias and neck pain.        Rt wrist pain.  Neurological:  Negative for facial asymmetry and headaches.    Past Medical History:  Diagnosis Date   Gout    Morbid obesity (HCC)      Social History   Socioeconomic History   Marital status: Single    Spouse name: Not on file   Number of children: Not on file   Years of education: Not on file   Highest education level: Not on file  Occupational History   Not on file  Tobacco Use   Smoking status: Never   Smokeless tobacco: Never  Vaping Use   Vaping Use: Never used  Substance and Sexual Activity   Alcohol use: No   Drug use: No   Sexual activity: Not on file  Other Topics Concern   Not on file  Social History Narrative   Not on file   Social Determinants of Health   Financial Resource Strain: Not on file  Food Insecurity: Not on file  Transportation Needs: Not on file  Physical Activity: Not on file  Stress: Not on file  Social Connections: Not on file  Intimate Partner Violence: Not on file    Past Surgical History:  Procedure Laterality Date   NO PAST SURGERIES      Family  History  Problem Relation Age of Onset   Healthy Mother    Diabetes Father     No Known Allergies  Current Outpatient Medications on File Prior to Visit  Medication Sig Dispense Refill   allopurinol (ZYLOPRIM) 100 MG tablet Take 1 tablet (100 mg total) by mouth daily. 30 tablet 6   colchicine 0.6 MG tablet Take 1 tablet (0.6 mg total) by mouth 2 (two) times daily. Take one twice a day if having acute gout flare up 60 tablet 2   No current facility-administered medications on file prior to visit.    BP 116/62 (BP Location: Right Arm, Patient Position: Sitting, Cuff Size: Large)   Pulse 71   Temp 98.3 F (36.8 C) (Oral)   Ht 5\' 11"  (1.803 m)   Wt (!) 486 lb 9.6 oz (220.7 kg)   SpO2 96%   BMI 67.87 kg/m        Objective:   Physical Exam  General- No acute distress. Pleasant patient. Neck- Full range of motion, no jvd Lungs- Clear, even and unlabored. Heart- regular rate and rhythm. Neurologic- CNII- XII grossly intact.  Rt wrist- mild tenderness on pronation in region of distal radius. But negative finkelstein test.  Assessment & Plan:   Patient Instructions  Rt wrist pain improved since last week but still present. Hx of gout. On allopurinol preventatively. Will get xray of wrist, cmp and uric acid level.   After review of lab and xray decide on additional med such as starting your colchicine or possible taper steroid. Might refer to sport med as well in light of your occupation.   Follow up date to be determined after lab review

## 2022-10-20 NOTE — Addendum Note (Signed)
Addended by: Gwenevere Abbot on: 10/20/2022 08:09 PM   Modules accepted: Orders

## 2022-10-20 NOTE — Patient Instructions (Addendum)
Rt wrist pain improved since last week but still present. Hx of gout. On allopurinol preventatively. Will get xray of wrist, cmp and uric acid level.   After review of lab and xray decide on additional med such as starting your colchicine or possible taper steroid. Might refer to sport med as well in light of your occupation.   Gave 2 inch ace wrap to provide some compression over wrist. Discussed how to wrap after xray.  Follow up date to be determined after lab review

## 2022-10-21 ENCOUNTER — Ambulatory Visit: Payer: BC Managed Care – PPO | Admitting: Podiatry

## 2022-10-21 ENCOUNTER — Encounter: Payer: Self-pay | Admitting: Podiatry

## 2022-10-21 DIAGNOSIS — M7672 Peroneal tendinitis, left leg: Secondary | ICD-10-CM

## 2022-10-21 DIAGNOSIS — M7671 Peroneal tendinitis, right leg: Secondary | ICD-10-CM

## 2022-10-21 DIAGNOSIS — M109 Gout, unspecified: Secondary | ICD-10-CM

## 2022-10-21 NOTE — Progress Notes (Signed)
Subjective:   Patient ID: Jesus Zuniga, male   DOB: 31 y.o.   MRN: [id number]   HPI Patient presents with caregiver stating that he is not having the pain he used to have still does get some discomfort at times especially with cold weather and does get some pain in his wrist and does continue to experience significant severe obesity but is trying to lose weight stating he lost 25 pounds neuro   ROS      Objective:  Physical Exam  Ocular status found to be intact muscle strength is adequate with patient found to have mild swelling in both feet mild varicosities and some inflammation around the lateral side of both feet at the current time.  Patient is found to have good digital perfusion well-oriented x 3     Assessment:  Gout which appears to be under good condition with somewhat chronic peroneal tendinitis bilateral under reasonable control currently     Plan:  H&P reviewed conditions and I have recommended continuation of allopurinol discussed that his uric acid from yesterday is still 8.8 so he needs to stay on the medicine may have to have an increased and possibility for other treatments in future.  Discussed colchicine usage discussed weight loss and patient will be seen back as needed

## 2022-11-10 ENCOUNTER — Ambulatory Visit: Payer: BC Managed Care – PPO | Admitting: Medical

## 2022-11-10 ENCOUNTER — Encounter: Payer: Self-pay | Admitting: Medical

## 2022-11-10 VITALS — BP 130/74 | HR 69 | Temp 98.0°F | Resp 18 | Ht 71.0 in | Wt >= 6400 oz

## 2022-11-10 DIAGNOSIS — R739 Hyperglycemia, unspecified: Secondary | ICD-10-CM

## 2022-11-10 DIAGNOSIS — Z6841 Body Mass Index (BMI) 40.0 and over, adult: Secondary | ICD-10-CM

## 2022-11-10 DIAGNOSIS — M1000 Idiopathic gout, unspecified site: Secondary | ICD-10-CM | POA: Diagnosis not present

## 2022-11-10 LAB — HEMOGLOBIN A1C: Hgb A1c MFr Bld: 6.1 % (ref 4.6–6.5)

## 2022-11-10 MED ORDER — METFORMIN HCL 500 MG PO TABS: 500.0000 mg | ORAL_TABLET | Freq: Every day | ORAL | 3 refills | Status: DC

## 2022-11-10 MED ORDER — ALLOPURINOL 300 MG PO TABS: 300.0000 mg | ORAL_TABLET | Freq: Every day | ORAL | 3 refills | Status: DC

## 2022-11-10 NOTE — Addendum Note (Signed)
Addended by: Anabel Halon on: 11/10/2022 03:40 PM   Modules accepted: Orders

## 2022-11-10 NOTE — Patient Instructions (Addendum)
Gout with recent flare/now resolved. Uric acid high. Think best to increase allopurinol to 300 mg daily. Continue low uric acid diet.  Elevated sugar- low sugar diet and get A1c.  Obesity-Diet and exercise as we discussed. Try [employer] app. May rx low dose metformin to help weight loss.    Follow up date to be determined after lab review.

## 2022-11-10 NOTE — Progress Notes (Signed)
Subjective:    Patient ID: Jesus Zuniga, male    DOB: [birth year], 32 y.o.   MRN: [id number]  HPI   Pt update me that his wrist pain is resolved. I rx'd medrol and pain resolved quickly.   Pt is presently on allopurinol. Not using colchicine presently.  He states has been on allopurinol since October. Around time of most recent flare admits was eating a lot of red meet.   Pt had mild sugar elevation on lab review no hx of diabetes.   Pt is obese. He is trying to lose weight. Has been walking daily and will start going to the gym. Pt is eating healthy and avoiding food causing got flares.   Review of Systems  Constitutional:  Negative for chills, fatigue and fever.  Respiratory:  Negative for cough, chest tightness, shortness of breath and wheezing.   Cardiovascular:  Negative for chest pain.  Gastrointestinal:  Negative for abdominal pain.  Genitourinary:  Negative for dysuria.    Past Medical History:  Diagnosis Date   Gout    Morbid obesity ([place])      Social History   Socioeconomic History   Marital status: Single    Spouse name: Not on file   Number of children: Not on file   Years of education: Not on file   Highest education level: Not on file  Occupational History   Not on file  Tobacco Use   Smoking status: Never   Smokeless tobacco: Never  Vaping Use   Vaping Use: Never used  Substance and Sexual Activity   Alcohol use: No   Drug use: No   Sexual activity: Not on file  Other Topics Concern   Not on file  Social History Narrative   Not on file   Social Determinants of Health   Financial Resource Strain: Not on file  Food Insecurity: Not on file  Transportation Needs: Not on file  Physical Activity: Not on file  Stress: Not on file  Social Connections: Not on file  Intimate Partner Violence: Not on file    Past Surgical History:  Procedure Laterality Date   NO PAST SURGERIES      Family History  Problem Relation Age of Onset    Healthy Mother    Diabetes Father     No Known Allergies  Current Outpatient Medications on File Prior to Visit  Medication Sig Dispense Refill   allopurinol (ZYLOPRIM) 100 MG tablet Take 1 tablet (100 mg total) by mouth daily. 30 tablet 6   colchicine 0.6 MG tablet Take 1 tablet (0.6 mg total) by mouth 2 (two) times daily. Take one twice a day if having acute gout flare up 60 tablet 2   methylPREDNISolone (MEDROL) 4 MG tablet Standard 6 day taper dose 21 tablet 0   No current facility-administered medications on file prior to visit.    BP 130/74   Pulse 69   Temp 98 F (36.7 C)   Resp 18   Ht 5\' 11"  (1.803 m)   Wt (!) 486 lb (220.4 kg)   SpO2 96%   BMI 67.78 kg/m        Objective:   Physical Exam  General Mental Status- Alert. General Appearance- Not in acute distress.   Skin General: Color- Normal Color. Moisture- Normal Moisture.  Neck Carotid Arteries- Normal color. Moisture- Normal Moisture. No carotid bruits. No JVD.  Chest and Lung Exam Auscultation: Breath Sounds:-Normal.  Cardiovascular Auscultation:Rythm- Regular. Murmurs & Other Heart Sounds:Auscultation of  the heart reveals- No Murmurs.  Abdomen Inspection:-Inspeection Normal. Palpation/Percussion:Note:No mass. Palpation and Percussion of the abdomen reveal- Non Tender, Non Distended + BS, no rebound or guarding.   Neurologic Cranial Nerve exam:- CN III-XII intact(No nystagmus), symmetric smile. Strength:- 5/5 equal and symmetric strength both upper and lower extremities.      Assessment & Plan:   Patient Instructions  Gout with recent flare. Uric acid high. Think best to increase allopurinol to 300 mg daily. Continue low uric acid diet.  Elevated sugar- low sugar diet and get A1c.  Obesity-Diet and exercise as we discussed. Try [employer] app. May rx low dose metformin to help weight loss.    Follow up date to be determined after lab review.     Mackie Pai, PA-C

## 2023-02-17 ENCOUNTER — Other Ambulatory Visit: Payer: Self-pay

## 2023-02-17 ENCOUNTER — Emergency Department (HOSPITAL_COMMUNITY)
Admission: EM | Admit: 2023-02-17 | Discharge: 2023-02-17 | Disposition: A | Discharge status: Home / Self Care | Payer: BC Managed Care – PPO | Attending: Emergency Medicine | Admitting: Emergency Medicine

## 2023-02-17 ENCOUNTER — Encounter (HOSPITAL_COMMUNITY): Payer: Self-pay

## 2023-02-17 DIAGNOSIS — M546 Pain in thoracic spine: Secondary | ICD-10-CM | POA: Diagnosis not present

## 2023-02-17 DIAGNOSIS — T148XXA Other injury of unspecified body region, initial encounter: Secondary | ICD-10-CM

## 2023-02-17 DIAGNOSIS — S29012A Strain of muscle and tendon of back wall of thorax, initial encounter: Secondary | ICD-10-CM | POA: Diagnosis not present

## 2023-02-17 MED ORDER — ACETAMINOPHEN 500 MG PO TABS
1000.0000 mg | ORAL_TABLET | Freq: Once | ORAL | Status: AC
  Administered 2023-02-17: 1000 mg via ORAL
  Filled 2023-02-17: qty 2

## 2023-02-17 MED ORDER — CYCLOBENZAPRINE HCL 10 MG PO TABS: 10.0000 mg | ORAL_TABLET | Freq: Two times a day (BID) | ORAL | 0 refills | Status: AC | PRN

## 2023-02-17 NOTE — ED Triage Notes (Signed)
C/o mid back pain x3 days.  Denies fall/trauma.  Pain is worse when twisting to the right.  Denies incontinent bowel/bladder Ambulatory to triage.

## 2023-02-17 NOTE — ED Provider Triage Note (Signed)
Emergency Medicine Provider Triage Evaluation Note  Jesus Zuniga , a 32 y.o. male  was evaluated in triage.  Pt complains of thoracic back pain that began 3 days ago.  Patient denies history of trauma but states if he twists his back to the right the right mid thoracic region will begin to hurt.  Patient denies any urinary/bowel incontinence, saddle anesthesia, fever/chills, IVDU, changes sensation/motor skills.  Patient states he has been able to walk without issue.  Review of Systems  Positive: See HPI Negative: See HPI  Physical Exam  There were no vitals taken for this visit. Gen:   Awake, no distress   Resp:  Normal effort  MSK:   Moves extremities without difficulty  Other:  No CVA tenderness, tender to palpation mid thoracic on right mid thoracic muscles, no midline tenderness, sensation intact distally with good pulses  Medical Decision Making  Medically screening exam initiated at 5:33 PM.  Appropriate orders placed.  Jesus Zuniga was informed that the remainder of the evaluation will be completed by another provider, this initial triage assessment does not replace that evaluation, and the importance of remaining in the ED until their evaluation is complete.  Workup initiated, patient stable at this time most likely muscle strain   Jesus Zuniga, [place] 02/17/23 1737

## 2023-02-17 NOTE — Discharge Instructions (Signed)
Please pick up the muscle relaxer I prescribed for you and only take at night before bed as this medication is sedating.  Please do not operate machinery or drive after taking this medication.  You may alternate every 6 hours as needed for pain between Tylenol and ibuprofen.  Today your physical exam did not show any red flag symptoms including urinary/bowel incontinence, numbness in your groin area, changes in sensation or numbness in your legs, fevers.  If you begin to experience the symptoms please return to ER immediately.  Your blood pressure was slightly elevated today however did normalize after receiving Tylenol and that this is most likely due to pain.  Please rest and monitor your symptoms as they may last for a week.  I will give you a work note for limited physical activity over the next week.  Please follow-up with your primary care provider regarding recent symptoms and ER visit.

## 2023-02-17 NOTE — ED Provider Notes (Signed)
[name] Provider Note   CSN: [id number] Arrival date & time: 02/17/23  1719     History  Chief Complaint  Patient presents with   Back Pain    Jesus Zuniga is a 32 y.o. male history of gout on allopurinol and colchicine thoracic back pain that began 3 days ago.  Patient denies history of trauma but states if he twists his back to the right the right mid thoracic region will begin to hurt.  Patient does note 3 days ago at work he was doing a lot of shoveling.  Patient denies any urinary/bowel incontinence, saddle anesthesia, fever/chills, IVDU, changes sensation/motor skills, trauma/falls.  Patient states he has been able to walk without issue.    Home Medications Prior to Admission medications   Medication Sig Start Date End Date Taking? Authorizing Provider  cyclobenzaprine (FLEXERIL) 10 MG tablet Take 1 tablet (10 mg total) by mouth 2 (two) times daily as needed for muscle spasms. 02/17/23  Yes Maykel Reitter, Beverly Gust, PA-C  allopurinol (ZYLOPRIM) 300 MG tablet Take 1 tablet (300 mg total) by mouth daily. 11/10/22   Saguier, Ramon Dredge, PA-C  colchicine 0.6 MG tablet Take 1 tablet (0.6 mg total) by mouth 2 (two) times daily. Take one twice a day if having acute gout flare up 08/24/22   Lenn Sink, DPM  metFORMIN (GLUCOPHAGE) 500 MG tablet Take 1 tablet (500 mg total) by mouth daily with breakfast. 11/10/22   Saguier, Ramon Dredge, PA-C  methylPREDNISolone (MEDROL) 4 MG tablet Standard 6 day taper dose 10/20/22   Saguier, Ramon Dredge, PA-C      Allergies    Patient has no known allergies.    Review of Systems   Review of Systems  Musculoskeletal:  Positive for back pain.    Physical Exam Updated Vital Signs BP (!) 158/90 (BP Location: Right Arm)   Pulse 90   Temp 98 F (36.7 C)   Resp 18   Wt (!) 220 kg   SpO2 97%   BMI 67.65 kg/m  Physical Exam Constitutional:      General: He is not in acute distress. Cardiovascular:      Pulses: Normal pulses.     Comments: 2+ bilateral radial/dorsalis pedal pulses with regular rate Abdominal:     Palpations: Abdomen is soft. There is no mass.     Tenderness: There is no abdominal tenderness. There is no right CVA tenderness, left CVA tenderness, guarding or rebound.  Musculoskeletal:        General: No deformity. Normal range of motion.     Comments: Tenderness to palpation in the mid thoracic region in the right paraspinal muscles No midline tenderness No step-off/crepitus/abnormalities palpated 5 out of 5 bilateral grip/leg extension strength  Skin:    General: Skin is warm and dry.     Capillary Refill: Capillary refill takes less than 2 seconds.     Findings: No rash.     Comments: No overlying skin color changes No track marks  Neurological:     Mental Status: He is alert.     Comments: Able to walk without abnormalities Sensation intact in all 4 limbs  Psychiatric:        Mood and Affect: Mood normal.     ED Results / Procedures / Treatments   Labs (all labs ordered are listed, but only abnormal results are displayed) Labs Reviewed - No data to display  EKG None  Radiology No results found.  Procedures Procedures  Medications Ordered in ED Medications  acetaminophen (TYLENOL) tablet 1,000 mg (1,000 mg Oral Given 02/17/23 1747)    ED Course/ Medical Decision Making/ A&P                             Medical Decision Making Risk OTC drugs.   [name] 32 y.o. presented today for back pain. Working DDx that I considered at this time includes, but not limited to, MSK, underlying fracture, epidural hematoma, cauda equina syndrome, spinal stenosis, spinal malignancy, dural abscess, discitis, spinal infection.  R/o DDx: underlying fracture, epidural hematoma, cauda equina syndrome, spinal stenosis, spinal malignancy, dural abscess, discitis, spinal infection : less likely due to history of present illness and physical exam  findings  Review of prior external notes: 11/18/2021 ED  Unique Tests and My Interpretation: None  Discussion with Independent Historian: None  Discussion of Management of Tests: None  Risk: Medium: prescription drug management  Risk Stratification Score: None  Plan: Patient presented for back pain. On exam patient was in no acute distress and did have elevated blood pressure of 187/101.  Patient states his blood pressure is normally not elevated and that he does not take any blood pressure medications.  Patient denied any chest pain, shortness of breath, new onset weakness or changes in sensation/motor skills.  Patient stated his back pain exacerbated with movement and with palpation in the mid thoracic region on the right side.  Patient did not endorse any urinary/bowel incontinence, saddle anesthesia.  Patient is able to walk without difficulty and has good patellar reflexes and sensation pulses motor skills distally.  At this time I spoke patient's blood pressure is elevated due to the back pain as he has not taken any pain meds but was given Tylenol in triage.  Patient most likely has a muscle strain and will be given Flexeril to take at night.  I spoke to the patient about this medication is sedating and to not drive or operate machinery after this medication.  I spoke to the patient with following up with his primary care provider about recent symptoms and ER visit as symptoms may change.  I spoke to the patient about how he may alternate between ibuprofen and Tylenol every 6 hours as needed for pain.  I spoke to the patient about the red flag symptoms listed above and to return to ED if he begins experiencing symptoms.  On recheck patient's blood pressure dropped to 158/90 after receiving Tylenol.  Patient states that after some Tylenol symptoms mildly improved and patient will be discharged with Flexeril and primary care follow-up and work note.  Patient was given return precautions.  Patient stable for discharge at this time.  Patient verbalized understanding of plan.         Final Clinical Impression(s) / ED Diagnoses Final diagnoses:  Muscle strain    Rx / DC Orders ED Discharge Orders          Ordered    cyclobenzaprine (FLEXERIL) 10 MG tablet  2 times daily PRN        02/17/23 1817              Netta Corrigan, PA-C 02/17/23 1819    Linwood Dibbles, MD 02/17/23 2238

## 2023-02-24 ENCOUNTER — Encounter: Payer: Self-pay | Admitting: Medical

## 2023-02-24 ENCOUNTER — Ambulatory Visit: Payer: BC Managed Care – PPO | Admitting: Medical

## 2023-02-24 VITALS — BP 140/80 | HR 95 | Resp 18 | Ht 71.0 in | Wt >= 6400 oz

## 2023-02-24 DIAGNOSIS — I10 Essential (primary) hypertension: Secondary | ICD-10-CM

## 2023-02-24 DIAGNOSIS — M5440 Lumbago with sciatica, unspecified side: Secondary | ICD-10-CM | POA: Diagnosis not present

## 2023-02-24 MED ORDER — LOSARTAN POTASSIUM 25 MG PO TABS: 25.0000 mg | ORAL_TABLET | Freq: Every day | ORAL | 11 refills | Status: DC

## 2023-02-24 NOTE — Patient Instructions (Addendum)
Back pain paraspinal and low level being 1/10 now. You request return to work no restriction and appears reasonable at this time. If back pain flares on return to work please notify me. Filled out form today. If within 1 week pain not subsiding completely get lumbar xray. Can use tylenol for low level pain now.  Htn- bp is elevated in ED and to some degree now still high. Rx losartan 25 mg daily.  Follow up in 10 days or sooner if needed.

## 2023-02-24 NOTE — Progress Notes (Addendum)
Subjective:    Patient ID: Jesus Zuniga, male    DOB: [birth year], 32 y.o.   MRN: [id number]  HPI Pt in today reporting recent back pain/strain. He went to ED on 02-17-2023.  " Jesus Zuniga is a 32 y.o. male history of gout on allopurinol and colchicine thoracic back pain that began 3 days ago.  Patient denies history of trauma but states if he twists his back to the right the right mid thoracic region will begin to hurt.  Patient does note 3 days ago at work he was doing a lot of shoveling.  Patient denies any urinary/bowel incontinence, saddle anesthesia, fever/chills, IVDU, changes sensation/motor skills, trauma/falls.  Patient states he has been able to walk without issue. "  "  Plan: Patient presented for back pain. On exam patient was in no acute distress and did have elevated blood pressure of 187/101.  Patient states his blood pressure is normally not elevated and that he does not take any blood pressure medications.  Patient denied any chest pain, shortness of breath, new onset weakness or changes in sensation/motor skills.  Patient stated his back pain exacerbated with movement and with palpation in the mid thoracic region on the right side.  Patient did not endorse any urinary/bowel incontinence, saddle anesthesia.  Patient is able to walk without difficulty and has good patellar reflexes and sensation pulses motor skills distally.  At this time I spoke patient's blood pressure is elevated due to the back pain as he has not taken any pain meds but was given Tylenol in triage.  Patient most likely has a muscle strain and will be given Flexeril to take at night.  I spoke to the patient about this medication is sedating and to not drive or operate machinery after this medication.  I spoke to the patient with following up with his primary care provider about recent symptoms and ER visit as symptoms may change.  I spoke to the patient about how he may alternate between ibuprofen and  Tylenol every 6 hours as needed for pain.  I spoke to the patient about the red flag symptoms listed above and to return to ED if he begins experiencing symptoms.   On recheck patient's blood pressure dropped to 158/90 after receiving Tylenol.  Patient states that after some Tylenol symptoms mildly improved and patient will be discharged with Flexeril and primary care follow-up and work note."   Pt bp was high in the ED but did some down to more reasonable level. Today bp is still high. No cardiac or neurlogic signs or symptoms. Pt told to take tylenol and give flexeril. Pain now very minimal and only on bending. Level pain 1/10.  He states was 9-10 level pain. No red flag signs or symptoms. He has been off of work all weak. Pt works Office manager. He feel like he is ready to return to work.   Review of Systems  Constitutional:  Negative for chills, fatigue and fever.  Respiratory:  Negative for cough, chest tightness, shortness of breath and wheezing.   Cardiovascular:  Negative for chest pain and palpitations.  Gastrointestinal:  Negative for abdominal pain and blood in stool.  Genitourinary:  Negative for dysuria, frequency and genital sores.  Musculoskeletal:  Positive for back pain. Negative for myalgias and neck stiffness.  Skin:  Negative for rash.  Neurological:  Negative for dizziness, seizures, syncope, weakness and light-headedness.  Hematological:  Negative for adenopathy. Does not bruise/bleed easily.  Psychiatric/Behavioral:  Negative  for agitation, confusion and dysphoric mood.     Past Medical History:  Diagnosis Date   Gout    Morbid obesity (HCC)      Social History   Socioeconomic History   Marital status: Single    Spouse name: Not on file   Number of children: Not on file   Years of education: Not on file   Highest education level: Not on file  Occupational History   Not on file  Tobacco Use   Smoking status: Never   Smokeless  tobacco: Never  Vaping Use   Vaping Use: Never used  Substance and Sexual Activity   Alcohol use: No   Drug use: No   Sexual activity: Not on file  Other Topics Concern   Not on file  Social History Narrative   Not on file   Social Determinants of Health   Financial Resource Strain: Not on file  Food Insecurity: Not on file  Transportation Needs: Not on file  Physical Activity: Not on file  Stress: Not on file  Social Connections: Not on file  Intimate Partner Violence: Not on file    Past Surgical History:  Procedure Laterality Date   NO PAST SURGERIES      Family History  Problem Relation Age of Onset   Healthy Mother    Diabetes Father     No Known Allergies  Current Outpatient Medications on File Prior to Visit  Medication Sig Dispense Refill   allopurinol (ZYLOPRIM) 300 MG tablet Take 1 tablet (300 mg total) by mouth daily. 90 tablet 3   colchicine 0.6 MG tablet Take 1 tablet (0.6 mg total) by mouth 2 (two) times daily. Take one twice a day if having acute gout flare up 60 tablet 2   cyclobenzaprine (FLEXERIL) 10 MG tablet Take 1 tablet (10 mg total) by mouth 2 (two) times daily as needed for muscle spasms. 20 tablet 0   metFORMIN (GLUCOPHAGE) 500 MG tablet Take 1 tablet (500 mg total) by mouth daily with breakfast. 90 tablet 3   No current facility-administered medications on file prior to visit.    BP (!) 140/80   Pulse 95   Resp 18   Ht 5\' 11"  (1.803 m)   Wt (!) 481 lb (218.2 kg)   SpO2 95%   BMI 67.09 kg/m        Objective:   Physical Exam  General Mental Status- Alert. General Appearance- Not in acute distress.   Skin General: Color- Normal Color. Moisture- Normal Moisture.  Neck Carotid Arteries- Normal color. Moisture- Normal Moisture. No carotid bruits. No JVD.  Chest and Lung Exam Auscultation: Breath Sounds:-Normal.  Cardiovascular Auscultation:Rythm- Regular. Murmurs & Other Heart Sounds:Auscultation of the heart reveals- No  Murmurs.  Abdomen Inspection:-Inspeection Normal. Palpation/Percussion:Note:No mass. Palpation and Percussion of the abdomen reveal- Non Tender, Non Distended + BS, no rebound or guarding.   Neurologic Cranial Nerve exam:- CN III-XII intact(No nystagmus), symmetric smile. Strength:- 5/5 equal and symmetric strength both upper and lower extremities.     Back  No Mid lumbar spine tenderness to palpation. But some left side paraspinal faint tenderness. No Pain on straight leg lift. Mild pain on leaning forward and on straightening spine.  Lower ext neurologic  L5-S1 sensation intact bilaterally. Normal patellar reflexes bilaterally. No foot drop bilaterally.    Lower ext- l5-s1 intact bilaterally.    Assessment & Plan:   Patient Instructions  Back pain paraspinal and low level being 1/10 now. You request return  to work no restriction and appears reasonable at this time. If back pain flares on return to work please notify me. Filled out form today. If within 1 week pain not subsiding completely get lumbar xray. Can use tylenol for low level pain now.  Htn- bp is elevated in ED and to some degree now still high. Rx losartan 25 mg daily.  Follow up in 10 days or sooner if needed.

## 2023-03-10 ENCOUNTER — Encounter: Payer: Self-pay | Admitting: Medical

## 2023-03-10 ENCOUNTER — Ambulatory Visit: Payer: BC Managed Care – PPO | Admitting: Medical

## 2023-03-10 VITALS — BP 128/70 | HR 80 | Temp 98.0°F | Resp 18 | Ht 71.0 in | Wt >= 6400 oz

## 2023-03-10 DIAGNOSIS — L603 Nail dystrophy: Secondary | ICD-10-CM

## 2023-03-10 DIAGNOSIS — M5440 Lumbago with sciatica, unspecified side: Secondary | ICD-10-CM

## 2023-03-10 DIAGNOSIS — I10 Essential (primary) hypertension: Secondary | ICD-10-CM | POA: Diagnosis not present

## 2023-03-10 NOTE — Progress Notes (Signed)
Subjective:    Patient ID: Jesus Zuniga, male    DOB: [birth year], 32 y.o.   MRN: [id number]  HPI  Pt states his back pain resolved completely on 02-25-2023 and 02-26-2023.  Has returned to work with no flare of pain.   Pt had high bp last visit. Started on losartan 25 mg daily. No side effects. Here for follow up. No cardiac or neurologic signs/symptoms.  Also one year of gradual slight color changes to both nails on hands. Pt has concern for fungus.   Review of Systems  Constitutional:  Negative for chills, fatigue and fever.  HENT:  Negative for congestion.   Respiratory:  Negative for cough, chest tightness, shortness of breath and wheezing.   Cardiovascular:  Negative for chest pain and palpitations.  Gastrointestinal:  Negative for abdominal pain.  Genitourinary:  Negative for dysuria.  Musculoskeletal:  Negative for back pain.  Skin:  Negative for rash.  Neurological:  Negative for dizziness, syncope, weakness, numbness and headaches.  Hematological:  Negative for adenopathy. Does not bruise/bleed easily.  Psychiatric/Behavioral:  Negative for behavioral problems, decreased concentration and dysphoric mood. The patient is not nervous/anxious.    Past Medical History:  Diagnosis Date   Gout    Morbid obesity (HCC)      Social History   Socioeconomic History   Marital status: Single    Spouse name: Not on file   Number of children: Not on file   Years of education: Not on file   Highest education level: Not on file  Occupational History   Not on file  Tobacco Use   Smoking status: Never   Smokeless tobacco: Never  Vaping Use   Vaping Use: Never used  Substance and Sexual Activity   Alcohol use: No   Drug use: No   Sexual activity: Not on file  Other Topics Concern   Not on file  Social History Narrative   Not on file   Social Determinants of Health   Financial Resource Strain: Not on file  Food Insecurity: Not on file  Transportation Needs: Not  on file  Physical Activity: Not on file  Stress: Not on file  Social Connections: Not on file  Intimate Partner Violence: Not on file    Past Surgical History:  Procedure Laterality Date   NO PAST SURGERIES      Family History  Problem Relation Age of Onset   Healthy Mother    Diabetes Father     No Known Allergies  Current Outpatient Medications on File Prior to Visit  Medication Sig Dispense Refill   allopurinol (ZYLOPRIM) 300 MG tablet Take 1 tablet (300 mg total) by mouth daily. 90 tablet 3   colchicine 0.6 MG tablet Take 1 tablet (0.6 mg total) by mouth 2 (two) times daily. Take one twice a day if having acute gout flare up 60 tablet 2   cyclobenzaprine (FLEXERIL) 10 MG tablet Take 1 tablet (10 mg total) by mouth 2 (two) times daily as needed for muscle spasms. 20 tablet 0   losartan (COZAAR) 25 MG tablet Take 1 tablet (25 mg total) by mouth daily. 30 tablet 11   metFORMIN (GLUCOPHAGE) 500 MG tablet Take 1 tablet (500 mg total) by mouth daily with breakfast. 90 tablet 3   No current facility-administered medications on file prior to visit.    BP 128/70   Pulse 80   Temp 98 F (36.7 C)   Resp 18   Ht 5\' 11"  (1.803 m)  Wt (!) 481 lb (218.2 kg)   SpO2 92%   BMI 67.09 kg/m        Objective:   Physical Exam  General Mental Status- Alert. General Appearance- Not in acute distress.   Skin General: Color- Normal Color. Moisture- Normal Moisture.  Neck Carotid Arteries- Normal color. Moisture- Normal Moisture. No carotid bruits. No JVD.  Chest and Lung Exam Auscultation: Breath Sounds:-Normal.  Cardiovascular Auscultation:Rythm- Regular. Murmurs & Other Heart Sounds:Auscultation of the heart reveals- No Murmurs.  Abdomen Inspection:-Inspeection Normal. Palpation/Percussion:Note:No mass. Palpation and Percussion of the abdomen reveal- Non Tender, Non Distended + BS, no rebound or guarding.   Neurologic Cranial Nerve exam:- CN III-XII intact(No  nystagmus), symmetric smile. Strength:- 5/5 equal and symmetric strength both upper and lower extremities.   Nails/derm- both hands mild dystrophic nails. Left hand 3rd finger nail mild whittish/yellow coloratin and little thicker apperance. Rt 3rd digit nail less dystrophic.    Assessment & Plan:   Patient Instructions  Htn- bp is better today when I checked. Continue losartan 25 mg daily. Discussed checking bp at home on occasion. Upper arm cuff vs wrist cuff. Which may work better. Can check accuracy against our manual reading in future if needed. Want to see bp closer to 130/80. But not over 140/90.  Glad to hear that your back pain has resolved.  For mid dysrtrophic nails both hands got koh prep. Discussed plan going forward if + for fungus.  Follow up 6 month or sooner if needed.   Esperanza Richters PA-C

## 2023-03-10 NOTE — Patient Instructions (Addendum)
Htn- bp is better today when I checked. Continue losartan 25 mg daily. Discussed checking bp at home on occasion. Upper arm cuff vs wrist cuff. Which may work better. Can check accuracy against our manual reading in future if needed. Want to see bp closer to 130/80. But not over 140/90.  Glad to hear that your back pain has resolved.  For mid dysrtrophic nails both hands got koh prep. Discussed plan going forward if + for fungus.  Follow up 6 month or sooner if needed.

## 2023-03-13 LAB — FUNGAL STAIN
FUNGAL SMEAR:: NONE SEEN
MICRO NUMBER:: 14971347
SPECIMEN QUALITY:: ADEQUATE

## 2023-09-15 ENCOUNTER — Encounter: Payer: Self-pay | Admitting: Medical

## 2023-09-15 ENCOUNTER — Ambulatory Visit: Payer: BC Managed Care – PPO | Admitting: Medical

## 2023-09-15 VITALS — BP 130/80 | HR 78 | Temp 98.0°F | Resp 18 | Ht 71.0 in | Wt >= 6400 oz

## 2023-09-15 DIAGNOSIS — Z6841 Body Mass Index (BMI) 40.0 and over, adult: Secondary | ICD-10-CM

## 2023-09-15 DIAGNOSIS — Z23 Encounter for immunization: Secondary | ICD-10-CM

## 2023-09-15 DIAGNOSIS — Z13220 Encounter for screening for lipoid disorders: Secondary | ICD-10-CM

## 2023-09-15 DIAGNOSIS — Z113 Encounter for screening for infections with a predominantly sexual mode of transmission: Secondary | ICD-10-CM | POA: Diagnosis not present

## 2023-09-15 DIAGNOSIS — Z0000 Encounter for general adult medical examination without abnormal findings: Secondary | ICD-10-CM

## 2023-09-15 DIAGNOSIS — R739 Hyperglycemia, unspecified: Secondary | ICD-10-CM | POA: Diagnosis not present

## 2023-09-15 DIAGNOSIS — I10 Essential (primary) hypertension: Secondary | ICD-10-CM | POA: Diagnosis not present

## 2023-09-15 DIAGNOSIS — Z8739 Personal history of other diseases of the musculoskeletal system and connective tissue: Secondary | ICD-10-CM

## 2023-09-15 LAB — COMPREHENSIVE METABOLIC PANEL
ALT: 22 U/L (ref 0–53)
AST: 14 U/L (ref 0–37)
Albumin: 3.9 g/dL (ref 3.5–5.2)
Alkaline Phosphatase: 107 U/L (ref 39–117)
BUN: 13 mg/dL (ref 6–23)
CO2: 30 meq/L (ref 19–32)
Calcium: 9.1 mg/dL (ref 8.4–10.5)
Chloride: 102 meq/L (ref 96–112)
Creatinine, Ser: 0.67 mg/dL (ref 0.40–1.50)
GFR: 123.82 mL/min (ref >60.00)
Glucose, Bld: 116 mg/dL — ABNORMAL HIGH (ref 70–99)
Potassium: 3.9 meq/L (ref 3.5–5.1)
Sodium: 139 meq/L (ref 135–145)
Total Bilirubin: 0.7 mg/dL (ref 0.2–1.2)
Total Protein: 6.7 g/dL (ref 6.0–8.3)

## 2023-09-15 LAB — CBC WITH DIFFERENTIAL/PLATELET
Basophils Absolute: 0 10*3/uL (ref 0.0–0.1)
Basophils Relative: 0.4 % (ref 0.0–3.0)
Eosinophils Absolute: 0.2 10*3/uL (ref 0.0–0.7)
Eosinophils Relative: 2.4 % (ref 0.0–5.0)
HCT: 44.6 % (ref 39.0–52.0)
Hemoglobin: 14.4 g/dL (ref 13.0–17.0)
Lymphocytes Relative: 24.3 % (ref 12.0–46.0)
Lymphs Abs: 1.7 10*3/uL (ref 0.7–4.0)
MCHC: 32.2 g/dL (ref 30.0–36.0)
MCV: 88.8 fL (ref 78.0–100.0)
Monocytes Absolute: 0.2 10*3/uL (ref 0.1–1.0)
Monocytes Relative: 3.6 % (ref 3.0–12.0)
Neutro Abs: 4.7 10*3/uL (ref 1.4–7.7)
Neutrophils Relative %: 69.3 % (ref 43.0–77.0)
Platelets: 188 10*3/uL (ref 150.0–400.0)
RBC: 5.02 Mil/uL (ref 4.22–5.81)
RDW: 15.2 % (ref 11.5–15.5)
WBC: 6.8 10*3/uL (ref 4.0–10.5)

## 2023-09-15 LAB — LIPID PANEL
Cholesterol: 145 mg/dL (ref 0–200)
HDL: 40.3 mg/dL (ref >39.00)
LDL Cholesterol: 76 mg/dL (ref 0–99)
NonHDL: 105.15
Total CHOL/HDL Ratio: 4
Triglycerides: 144 mg/dL (ref 0.0–149.0)
VLDL: 28.8 mg/dL (ref 0.0–40.0)

## 2023-09-15 LAB — URIC ACID: Uric Acid, Serum: 6.1 mg/dL (ref 4.0–7.8)

## 2023-09-15 NOTE — Patient Instructions (Addendum)
For you wellness exam today I have ordered cbc, cmp, lipid panel and hiv.  Vaccine tdap and flu vaccine  Recommend exercise and healthy diet.  We will let you know lab results as they come in.  Follow up date appointment will be determined after lab review.    Hypertension Blood pressure initially high at 156/70, but subsequent readings at 130/80. Currently on Losartan 25mg  daily. -Continue Losartan 25mg  daily. -Recommend patient to purchase a home blood pressure monitor for regular checks.  Obesity Patient in the obese category, with potential risk for developing diabetes and hypertension. Previous successful weight loss with diet and exercise. -Encourage continued efforts for weight loss through diet and exercise.  Prediabetes Patient has been in the prediabetic range and is currently on Metformin 500mg  daily. A1c to be checked today. -Continue Metformin 500mg  daily. -Increase Metformin to 500mg  twice daily to aid in weight loss. -Consider Ozempic if A1c results indicate diabetes.  Hyperlipidemia Cholesterol to be checked today as part of wellness exam. -Plan based on results of cholesterol check.  Preventive Care 35-20 Years Old, Male Preventive care refers to lifestyle choices and visits with your health care provider that can promote health and wellness. Preventive care visits are also called wellness exams. What can I expect for my preventive care visit? Counseling During your preventive care visit, your health care provider may ask about your: Medical history, including: Past medical problems. Family medical history. Current health, including: Emotional well-being. Home life and relationship well-being. Sexual activity. Lifestyle, including: Alcohol, nicotine or tobacco, and drug use. Access to firearms. Diet, exercise, and sleep habits. Safety issues such as seatbelt and bike helmet use. Sunscreen use. Work and work Astronomer. Physical exam Your health care  provider may check your: Height and weight. These may be used to calculate your BMI (body mass index). BMI is a measurement that tells if you are at a healthy weight. Waist circumference. This measures the distance around your waistline. This measurement also tells if you are at a healthy weight and may help predict your risk of certain diseases, such as type 2 diabetes and high blood pressure. Heart rate and blood pressure. Body temperature. Skin for abnormal spots. What immunizations do I need?  Vaccines are usually given at various ages, according to a schedule. Your health care provider will recommend vaccines for you based on your age, medical history, and lifestyle or other factors, such as travel or where you work. What tests do I need? Screening Your health care provider may recommend screening tests for certain conditions. This may include: Lipid and cholesterol levels. Diabetes screening. This is done by checking your blood sugar (glucose) after you have not eaten for a while (fasting). Hepatitis B test. Hepatitis C test. HIV (human immunodeficiency virus) test. STI (sexually transmitted infection) testing, if you are at risk. Talk with your health care provider about your test results, treatment options, and if necessary, the need for more tests. Follow these instructions at home: Eating and drinking  Eat a healthy diet that includes fresh fruits and vegetables, whole grains, lean protein, and low-fat dairy products. Drink enough fluid to keep your urine pale yellow. Take vitamin and mineral supplements as recommended by your health care provider. Do not drink alcohol if your health care provider tells you not to drink. If you drink alcohol: Limit how much you have to 0-2 drinks a day. Know how much alcohol is in your drink. In the [place] one drink equals one 12 oz bottle of  beer (355 mL), one 5 oz glass of wine (148 mL), or one 1 oz glass of hard liquor (44  mL). Lifestyle Brush your teeth every morning and night with fluoride toothpaste. Floss one time each day. Exercise for at least 30 minutes 5 or more days each week. Do not use any products that contain nicotine or tobacco. These products include cigarettes, chewing tobacco, and vaping devices, such as e-cigarettes. If you need help quitting, ask your health care provider. Do not use drugs. If you are sexually active, practice safe sex. Use a condom or other form of protection to prevent STIs. Find healthy ways to manage stress, such as: Meditation, yoga, or listening to music. Journaling. Talking to a trusted person. Spending time with friends and family. Minimize exposure to UV radiation to reduce your risk of skin cancer. Safety Always wear your seat belt while driving or riding in a vehicle. Do not drive: If you have been drinking alcohol. Do not ride with someone who has been drinking. If you have been using any mind-altering substances or drugs. While texting. When you are tired or distracted. Wear a helmet and other protective equipment during sports activities. If you have firearms in your house, make sure you follow all gun safety procedures. Seek help if you have been physically or sexually abused. What's next? Go to your health care provider once a year for an annual wellness visit. Ask your health care provider how often you should have your eyes and teeth checked. Stay up to date on all vaccines. This information is not intended to replace advice given to you by your health care provider. Make sure you discuss any questions you have with your health care provider. Document Revised: 04/07/2021 Document Reviewed: 04/07/2021 Elsevier Patient Education  2024 [employer].

## 2023-09-15 NOTE — Progress Notes (Signed)
Subjective:    Patient ID: Jesus Zuniga, male    DOB: Jun 01, 1991, 32 y.o.   MRN: [id number]  HPI  Pt in for follow up htn . After review  chart also decided to go ahead and do wellness exam since on review pt had not had yet.  Pt works [employer]. He operates heavy machinery. Pt states eats healthy. Nonsmoker. No alcohol. Not married.   Plans to eat better and to to gym in near future to lose wait. He states working a lot recently.  Pt agrees to tdap. He states will get flu vaccine today as well.  Discussed the use of AI scribe software for clinical note transcription with the patient, who gave verbal consent to proceed.  History of Present Illness   The patient, with a history of hypertension, obesity, and prediabetes, has been on Losartan 25mg  daily. He reports no symptoms of headache or chest pain. His blood pressure readings have been variable, with the last recorded reading being 128/70, but a recent reading showed an elevated 156/70.  The patient acknowledges the need for weight loss, understanding its potential impact on his blood pressure and overall health. He has previously been successful in losing weight through improved diet and exercise, reporting a loss of 20 pounds in a month and a half prior to July.  In addition to hypertension, the patient has been in the prediabetic range. He has been taking Metformin 500mg  daily, initially prescribed last year, without any reported gastrointestinal side effects. The patient's A1c levels are being monitored to assess if he has moved into the diabetic range.       Past Medical History:  Diagnosis Date   Gout    Morbid obesity (HCC)      Social History   Socioeconomic History   Marital status: Single    Spouse name: Not on file   Number of children: Not on file   Years of education: Not on file   Highest education level: Not on file  Occupational History   Not on file  Tobacco Use   Smoking status: Never    Smokeless tobacco: Never  Vaping Use   Vaping status: Never Used  Substance and Sexual Activity   Alcohol use: No   Drug use: No   Sexual activity: Not on file  Other Topics Concern   Not on file  Social History Narrative   Not on file   Social Determinants of Health   Financial Resource Strain: Not on file  Food Insecurity: Not on file  Transportation Needs: Not on file  Physical Activity: Not on file  Stress: Not on file  Social Connections: Unknown (03/07/2022)   Received from [facility]   Social Network    Social Network: Not on file  Intimate Partner Violence: Unknown (01/27/2022)   Received from [facility]   HITS    Physically Hurt: Not on file    Insult or Talk Down To: Not on file    Threaten Physical Harm: Not on file    Scream or Curse: Not on file    Past Surgical History:  Procedure Laterality Date   NO PAST SURGERIES      Family History  Problem Relation Age of Onset   Healthy Mother    Diabetes Father     No Known Allergies  Current Outpatient Medications on File Prior to Visit  Medication Sig Dispense Refill   allopurinol (ZYLOPRIM) 300 MG tablet Take 1 tablet (  300 mg total) by mouth daily. 90 tablet 3   colchicine 0.6 MG tablet Take 1 tablet (0.6 mg total) by mouth 2 (two) times daily. Take one twice a day if having acute gout flare up 60 tablet 2   cyclobenzaprine (FLEXERIL) 10 MG tablet Take 1 tablet (10 mg total) by mouth 2 (two) times daily as needed for muscle spasms. 20 tablet 0   losartan (COZAAR) 25 MG tablet Take 1 tablet (25 mg total) by mouth daily. 30 tablet 11   metFORMIN (GLUCOPHAGE) 500 MG tablet Take 1 tablet (500 mg total) by mouth daily with breakfast. 90 tablet 3   No current facility-administered medications on file prior to visit.    BP 130/80   Pulse 78   Temp 98 F (36.7 C)   Resp 18   Ht 5\' 11"  (1.803 m)   Wt (!) 494 lb (224.1 kg)   SpO2 96%   BMI 68.90 kg/m       Review of  Systems  Constitutional:  Negative for chills, fatigue and fever.  HENT:  Negative for congestion and ear discharge.   Respiratory:  Negative for cough, chest tightness and wheezing.   Cardiovascular:  Negative for chest pain and palpitations.  Gastrointestinal:  Negative for abdominal pain, constipation and nausea.  Genitourinary:  Negative for dysuria and frequency.  Musculoskeletal:  Negative for back pain.  Skin:  Negative for rash.  Neurological:  Negative for dizziness, speech difficulty and light-headedness.  Hematological:  Negative for adenopathy. Does not bruise/bleed easily.  Psychiatric/Behavioral:  Negative for behavioral problems and decreased concentration.        Objective:   Physical Exam  General Mental Status- Alert. General Appearance- Not in acute distress.   Skin General: Color- Normal Color. Moisture- Normal Moisture.  Neck Carotid Arteries- Normal color. Moisture- Normal Moisture. No carotid bruits. No JVD.  Chest and Lung Exam Auscultation: Breath Sounds:-Normal.  Cardiovascular Auscultation:Rythm- Regular. Murmurs & Other Heart Sounds:Auscultation of the heart reveals- No Murmurs.  Abdomen Inspection:-Inspeection Normal. Palpation/Percussion:Note:No mass. Palpation and Percussion of the abdomen reveal- Non Tender, Non Distended + BS, no rebound or guarding.   Neurologic Cranial Nerve exam:- CN III-XII intact(No nystagmus), symmetric smile. Strength:- 5/5 equal and symmetric strength both upper and lower extremities.       Assessment & Plan:   Patient Instructions  For you wellness exam today I have ordered cbc, cmp, lipid panel and hiv.  Vaccine tdap and flu vaccine  Recommend exercise and healthy diet.  We will let you know lab results as they come in.  Follow up date appointment will be determined after lab review.     Hypertension Blood pressure initially high at 156/70, but subsequent readings at 130/80. Currently on Losartan  25mg  daily. -Continue Losartan 25mg  daily. -Recommend patient to purchase a home blood pressure monitor for regular checks.  Obesity Patient in the obese category, with potential risk for developing diabetes and hypertension. Previous successful weight loss with diet and exercise. -Encourage continued efforts for weight loss through diet and exercise.  Prediabetes Patient has been in the prediabetic range and is currently on Metformin 500mg  daily. A1c to be checked today. -Continue Metformin 500mg  daily. -Increase Metformin to 500mg  twice daily to aid in weight loss. -Consider Ozempic if A1c results indicate diabetes.  Hyperlipidemia Cholesterol to be checked today as part of wellness exam. -Plan based on results of cholesterol check.     [id number] charge as did address/discuss  htn, obesity and discussed prediabetes.

## 2023-09-15 NOTE — Addendum Note (Signed)
Addended by: Maximino Sarin on: 09/15/2023 11:07 AM   Modules accepted: Orders

## 2023-09-16 LAB — HIV ANTIBODY (ROUTINE TESTING W REFLEX): HIV 1&2 Ab, 4th Generation: NONREACTIVE

## 2023-09-16 MED ORDER — SEMAGLUTIDE-WEIGHT MANAGEMENT 0.25 MG/0.5ML ~~LOC~~ SOAJ: 0.2500 mg | SUBCUTANEOUS | 0 refills | Status: AC

## 2023-09-16 NOTE — Addendum Note (Signed)
Addended by: Gwenevere Abbot on: 09/16/2023 08:17 AM   Modules accepted: Orders

## 2023-09-18 ENCOUNTER — Telehealth: Payer: Self-pay

## 2023-09-18 NOTE — Telephone Encounter (Signed)
PA initiated via Covermymeds; KEY: [id number]. Awaiting determination

## 2023-09-19 NOTE — Telephone Encounter (Signed)
PA approved.   Message from plan: Approved. . Authorization Expiration Date: January 22, 2024.

## 2023-10-25 ENCOUNTER — Other Ambulatory Visit: Payer: Self-pay | Admitting: Medical

## 2023-12-18 ENCOUNTER — Encounter: Payer: Self-pay | Admitting: Medical

## 2023-12-19 MED ORDER — SEMAGLUTIDE-WEIGHT MANAGEMENT 0.5 MG/0.5ML ~~LOC~~ SOAJ: 0.5000 mg | SUBCUTANEOUS | 0 refills | Status: AC

## 2023-12-19 MED ORDER — SEMAGLUTIDE-WEIGHT MANAGEMENT 1 MG/0.5ML ~~LOC~~ SOAJ: 1.0000 mg | SUBCUTANEOUS | 0 refills | Status: DC

## 2023-12-19 MED ORDER — SEMAGLUTIDE-WEIGHT MANAGEMENT 1.7 MG/0.75ML ~~LOC~~ SOAJ: 1.7000 mg | SUBCUTANEOUS | 0 refills | Status: DC

## 2023-12-19 MED ORDER — SEMAGLUTIDE-WEIGHT MANAGEMENT 0.25 MG/0.5ML ~~LOC~~ SOAJ: 0.2500 mg | SUBCUTANEOUS | 0 refills | Status: AC

## 2023-12-19 MED ORDER — SEMAGLUTIDE-WEIGHT MANAGEMENT 2.4 MG/0.75ML ~~LOC~~ SOAJ: 2.4000 mg | SUBCUTANEOUS | 0 refills | Status: DC

## 2023-12-19 NOTE — Addendum Note (Signed)
 Addended by: Gwenevere Abbot on: 12/19/2023 08:37 AM   Modules accepted: Orders

## 2024-01-23 ENCOUNTER — Other Ambulatory Visit: Payer: Self-pay | Admitting: Medical

## 2024-02-08 ENCOUNTER — Ambulatory Visit: Admitting: Medical

## 2024-02-08 VITALS — BP 138/72 | HR 78 | Resp 18 | Ht 71.0 in | Wt >= 6400 oz

## 2024-02-08 DIAGNOSIS — I10 Essential (primary) hypertension: Secondary | ICD-10-CM | POA: Diagnosis not present

## 2024-02-08 NOTE — Progress Notes (Signed)
   Subjective:    Patient ID: Jesus Zuniga, male    DOB: 1991-05-04, 33 y.o.   MRN: [id number]  HPI Jesus Zuniga is a 33 year old male with obesity and prediabetes who presents for follow-up on weight management and medication adjustment.  He was previously prescribed metformin  500 mg twice a day but discontinued it after starting Wegovy , which was covered by his insurance. He is currently on a 0.25 mg dose of Wegovy  once a week. Since starting Wegovy , he feels full quickly and has been eating salads, although he occasionally consumes fast food.  He has been working 12-hour shifts, which has limited his ability to exercise. He plans to resume walking in the park on weekends once his work schedule changes to 10-hour shifts in two weeks. His current work schedule is Monday through Friday with two days off, but he is currently working six days a week. His biggest meal of the day is lunch, and he sometimes eats late at night.  He is also on losartan  25 mg for hypertension, which is reasonably controlled with a blood pressure reading of 138/72.     Review of Systems See hpi    Objective:   Physical Exam  General Mental Status- Alert. General Appearance- Not in acute distress. Morbid obese body habitus.  Skin General: Color- Normal Color. Moisture- Normal Moisture.  Neck  No JVD.  Chest and Lung Exam Auscultation: Breath Sounds:-CTA  Cardiovascular Auscultation:Rythm- RRR Murmurs & Other Heart Sounds:Auscultation of the heart reveals- No Murmurs.  Abdomen Inspection:-Inspeection Normal. Palpation/Percussion:Note:No mass. Palpation and Percussion of the abdomen reveal- Non Tender, Non Distended + BS, no rebound or guarding.    Neurologic Cranial Nerve exam:- CN III-XII intact(No nystagmus), symmetric smile. Strength:- 5/5 equal and symmetric strength both upper and lower extremities.       Assessment & Plan:   Patient Instructions  Obesity Weight gain  recently. Inability to  exercise from long work shifts likely playing a role as well as diet. Wegovy  initiated for weight management. Referral to weight loss clinic for support. Expected weight loss with increased activity and regular schedule. - Increase Wegovy  dose to 0.5 mg after current pen is finished. - Refer to medical weight loss management clinic. - Monitor weight and exercise routine after returning to regular work schedule. - Increase Wegovy  dose monthly if tolerated and no adverse effects. - Send MyChart message when ready to increase Wegovy  dose.  Prediabetes -low sugar diet and exercise.  Hypertension Controlled with losartan  25 mg daily. Blood pressure stable at 138/72 mmHg. - Continue losartan  25 mg daily. - Monitor blood pressure regularly.  Follow up in 3 months or sooner if needed   [employer], PA-C

## 2024-02-08 NOTE — Patient Instructions (Signed)
 Obesity Weight gain recently. Inability to  exercise from long work shifts likely playing a role as well as diet. Wegovy initiated for weight management. Referral to weight loss clinic for support. Expected weight loss with increased activity and regular schedule. - Increase Wegovy dose to 0.5 mg after current pen is finished. - Refer to medical weight loss management clinic. - Monitor weight and exercise routine after returning to regular work schedule. - Increase Wegovy dose monthly if tolerated and no adverse effects. - Send MyChart message when ready to increase Wegovy dose.  Prediabetes -low sugar diet and exercise.  Hypertension Controlled with losartan 25 mg daily. Blood pressure stable at 138/72 mmHg. - Continue losartan 25 mg daily. - Monitor blood pressure regularly.  Follow up in 3 months or sooner if needed

## 2024-02-25 ENCOUNTER — Encounter: Payer: Self-pay | Admitting: Medical

## 2024-02-26 MED ORDER — WEGOVY 0.5 MG/0.5ML ~~LOC~~ SOAJ: 0.5000 mg | SUBCUTANEOUS | 0 refills | Status: DC

## 2024-02-26 NOTE — Addendum Note (Signed)
 Addended by: Serafina Damme on: 02/26/2024 04:36 PM   Modules accepted: Orders

## 2024-03-01 MED ORDER — WEGOVY 0.5 MG/0.5ML ~~LOC~~ SOAJ: 0.5000 mg | SUBCUTANEOUS | 0 refills | Status: AC

## 2024-03-01 NOTE — Addendum Note (Signed)
 Addended by: Serafina Damme on: 03/01/2024 05:00 PM   Modules accepted: Orders

## 2024-03-21 ENCOUNTER — Telehealth: Payer: Self-pay

## 2024-03-21 ENCOUNTER — Other Ambulatory Visit (HOSPITAL_COMMUNITY): Payer: Self-pay

## 2024-03-21 NOTE — Telephone Encounter (Signed)
 Pharmacy Patient Advocate Encounter   Received notification from CoverMyMeds that prior authorization for Wegovy  0.5 is required/requested.   Insurance verification completed.   The patient is insured through [facility] .   Per test claim: PA required; PA submitted to above mentioned insurance via CoverMyMeds Key/confirmation #/EOC [facility] Status is pending

## 2024-03-22 ENCOUNTER — Other Ambulatory Visit (HOSPITAL_COMMUNITY): Payer: Self-pay

## 2024-03-22 NOTE — Telephone Encounter (Signed)
 Pharmacy Patient Advocate Encounter  Received notification from [facility] that Prior Authorization for Wegovy  0.5 has been APPROVED from 03/21/24 to 09/17/24. Ran test claim, Copay is $350.00. This test claim was processed through [facility]- copay amounts may vary at other pharmacies due to pharmacy/plan contracts, or as the patient moves through the different stages of their insurance plan.   PA #/Case ID/Reference #: Jesus Zuniga

## 2024-04-01 ENCOUNTER — Other Ambulatory Visit (HOSPITAL_COMMUNITY): Payer: Self-pay

## 2024-04-12 ENCOUNTER — Ambulatory Visit: Admitting: Medical

## 2024-04-22 ENCOUNTER — Encounter (INDEPENDENT_AMBULATORY_CARE_PROVIDER_SITE_OTHER): Payer: Self-pay

## 2024-04-22 ENCOUNTER — Other Ambulatory Visit: Payer: Self-pay | Admitting: Medical

## 2024-05-03 ENCOUNTER — Other Ambulatory Visit: Payer: Self-pay | Admitting: Medical

## 2024-06-07 ENCOUNTER — Ambulatory Visit: Admitting: Medical

## 2024-07-03 ENCOUNTER — Other Ambulatory Visit: Payer: Self-pay

## 2024-07-03 ENCOUNTER — Encounter (HOSPITAL_BASED_OUTPATIENT_CLINIC_OR_DEPARTMENT_OTHER): Payer: Self-pay | Admitting: Emergency Medicine

## 2024-07-03 ENCOUNTER — Emergency Department (HOSPITAL_BASED_OUTPATIENT_CLINIC_OR_DEPARTMENT_OTHER)
Admission: EM | Admit: 2024-07-03 | Discharge: 2024-07-03 | Disposition: A | Discharge status: Home / Self Care | Source: Ambulatory Visit | Attending: Emergency Medicine | Admitting: Emergency Medicine

## 2024-07-03 DIAGNOSIS — R000 Tachycardia, unspecified: Secondary | ICD-10-CM | POA: Diagnosis not present

## 2024-07-03 DIAGNOSIS — R002 Palpitations: Secondary | ICD-10-CM | POA: Insufficient documentation

## 2024-07-03 LAB — CBC
HCT: 44.6 % (ref 39.0–52.0)
Hemoglobin: 14.8 g/dL (ref 13.0–17.0)
MCH: 28.8 pg (ref 26.0–34.0)
MCHC: 33.2 g/dL (ref 30.0–36.0)
MCV: 86.9 fL (ref 80.0–100.0)
Platelets: 185 K/uL (ref 150–400)
RBC: 5.13 MIL/uL (ref 4.22–5.81)
RDW: 14.2 % (ref 11.5–15.5)
WBC: 9.5 K/uL (ref 4.0–10.5)
nRBC: 0 % (ref 0.0–0.2)

## 2024-07-03 LAB — COMPREHENSIVE METABOLIC PANEL WITH GFR
ALT: 41 U/L (ref 0–44)
AST: 28 U/L (ref 15–41)
Albumin: 4.1 g/dL (ref 3.5–5.0)
Alkaline Phosphatase: 135 U/L — ABNORMAL HIGH (ref 38–126)
Anion gap: 14 (ref 5–15)
BUN: 11 mg/dL (ref 6–20)
CO2: 21 mmol/L — ABNORMAL LOW (ref 22–32)
Calcium: 9.8 mg/dL (ref 8.9–10.3)
Chloride: 105 mmol/L (ref 98–111)
Creatinine, Ser: 0.77 mg/dL (ref 0.61–1.24)
GFR, Estimated: >60 mL/min (ref >60)
Glucose, Bld: 105 mg/dL — ABNORMAL HIGH (ref 70–99)
Potassium: 4.2 mmol/L (ref 3.5–5.1)
Sodium: 140 mmol/L (ref 135–145)
Total Bilirubin: 0.5 mg/dL (ref 0.0–1.2)
Total Protein: 7.7 g/dL (ref 6.5–8.1)

## 2024-07-03 LAB — CBG MONITORING, ED: Glucose-Capillary: 110 mg/dL — ABNORMAL HIGH (ref 70–99)

## 2024-07-03 LAB — D-DIMER, QUANTITATIVE: D-Dimer, Quant: <0.27 ug{FEU}/mL (ref 0.00–0.50)

## 2024-07-03 LAB — TROPONIN T, HIGH SENSITIVITY: Troponin T High Sensitivity: <15 ng/L (ref 0–19)

## 2024-07-03 LAB — LIPASE, BLOOD: Lipase: 19 U/L (ref 11–51)

## 2024-07-03 NOTE — ED Provider Notes (Signed)
 [place] EMERGENCY DEPARTMENT AT [facility] Provider Note   CSN: [id number] Arrival date & time: 07/03/24  1725     Patient presents with: Tachycardia and Nausea   Jesus Zuniga is a 33 y.o. male.  HPI  Patient is a 33 year old male presented ED today for concerns for heart palpitations and some nausea that started today.  Reported that he recently come back from a trip to the [employer], 5-hour car ride last week.  Went to urgent care to be evaluated and was shown to have sinus tachycardia with a heart rate of 102 and told to come to ED for rule out of blood clot.  Denies blood clot history, cancer, unexplained weight loss, leg swelling, hormone therapy.  Denies fever, headache, vision changes, unilateral weakness, sore throat, cough, congestion, chest pain, shortness of breath, abdominal pain, nausea, vomiting, diarrhea, dysuria, lower leg swelling/lower leg pain.     Prior to Admission medications   Medication Sig Start Date End Date Taking? Authorizing Provider  allopurinol  (ZYLOPRIM ) 300 MG tablet Take 1 tablet by mouth once daily 04/22/24   Saguier, [place], PA-C  colchicine  0.6 MG tablet Take 1 tablet (0.6 mg total) by mouth 2 (two) times daily. Take one twice a day if having acute gout flare up 08/24/22   Magdalen Pasco RAMAN, DPM  cyclobenzaprine  (FLEXERIL ) 10 MG tablet Take 1 tablet (10 mg total) by mouth 2 (two) times daily as needed for muscle spasms. 02/17/23   Victor Lynwood DASEN, PA-C  losartan  (COZAAR ) 25 MG tablet Take 1 tablet (25 mg total) by mouth daily. 05/03/24   Saguier, [place], PA-C  metFORMIN  (GLUCOPHAGE ) 500 MG tablet Take 1 tablet by mouth once daily with breakfast 04/22/24   Saguier, [place], PA-C  Semaglutide -Weight Management (WEGOVY ) 0.5 MG/0.5ML SOAJ Inject 0.5 mg into the skin once a week. 03/01/24   Saguier, [place], PA-C    Allergies: Patient has no known allergies.    Review of Systems  Cardiovascular:  Positive for palpitations.  All other  systems reviewed and are negative.   Updated Vital Signs BP (!) 115/59   Pulse 78   Temp 98 F (36.7 C) (Oral)   Resp 20   SpO2 93%   Physical Exam Vitals and nursing note reviewed.  Constitutional:      General: He is not in acute distress.    Appearance: He is obese. He is not ill-appearing or diaphoretic.  HENT:     Head: Normocephalic and atraumatic.  Eyes:     General: No scleral icterus.       Right eye: No discharge.        Left eye: No discharge.     Extraocular Movements: Extraocular movements intact.     Conjunctiva/sclera: Conjunctivae normal.  Cardiovascular:     Rate and Rhythm: Normal rate and regular rhythm.     Pulses: Normal pulses.     Heart sounds: Normal heart sounds. No murmur heard.    No friction rub. No gallop.  Pulmonary:     Effort: Pulmonary effort is normal. No respiratory distress.     Breath sounds: No stridor. No wheezing, rhonchi or rales.  Chest:     Chest wall: No tenderness.  Abdominal:     General: Abdomen is flat. There is no distension.     Palpations: Abdomen is soft.     Tenderness: There is no abdominal tenderness. There is no right CVA tenderness, left CVA tenderness, guarding or rebound.  Musculoskeletal:  General: No swelling, deformity or signs of injury.     Cervical back: Normal range of motion. No rigidity.     Right lower leg: No edema.     Left lower leg: No edema.     Comments: Negative Homans' sign bilaterally  Skin:    General: Skin is warm and dry.     Findings: No bruising, erythema or lesion.  Neurological:     General: No focal deficit present.     Mental Status: He is alert and oriented to person, place, and time. Mental status is at baseline.     Sensory: No sensory deficit.     Motor: No weakness.  Psychiatric:        Mood and Affect: Mood normal.     (all labs ordered are listed, but only abnormal results are displayed) Labs Reviewed  COMPREHENSIVE METABOLIC PANEL WITH GFR - Abnormal; Notable  for the following components:      Result Value   CO2 21 (*)    Glucose, Bld 105 (*)    Alkaline Phosphatase 135 (*)    All other components within normal limits  CBG MONITORING, ED - Abnormal; Notable for the following components:   Glucose-Capillary 110 (*)    All other components within normal limits  LIPASE, BLOOD  CBC  D-DIMER, QUANTITATIVE (NOT AT [facility])  TROPONIN T, HIGH SENSITIVITY  TROPONIN T, HIGH SENSITIVITY    EKG: EKG Interpretation Date/Time:  Wednesday July 03 2024 17:32:11 EDT Ventricular Rate:  96 PR Interval:  104 QRS Duration:  92 QT Interval:  334 QTC Calculation: 422 R Axis:   21  Text Interpretation: Sinus rhythm Short PR interval Borderline T abnormalities, inferior leads No old tracing to compare Confirmed by Randol Simmonds [phone]) on 07/03/2024 5:43:41 PM  Radiology: No results found.   Procedures   Medications Ordered in the ED - No data to display          HEART Score: 1                    Medical Decision Making Amount and/or Complexity of Data Reviewed Labs: ordered.   This patient is a 33 year old male who presents to the ED for concern of acute onset palpitations, seen by urgent care today and told to come to ED due to having mild tachycardia on their ECG and was told to come to the ED to evaluate for blood clot with his recent travel to the [employer].  On physical exam, patient is in no acute distress, afebrile, alert and orient x 4, speaking in full sentences, nontachypneic, nontachycardic.  LCTAB, RRR, no murmur.  No lower leg edema.  Negative Homans' sign bilaterally.  With patient's current presentation, low suspicion for pneumonia, pneumothorax, ACS with patient currently not experiencing any chest pain or shortness of breath, not tachycardic.  Consider doing chest x-ray but not believe is warranted at this time.  Lab work was done and shown to be unremarkable, with no signs of PE, ACS, patient still resting comfortably with no  acute complaints.  Currently symptomatic at this time.  Will have him continue to follow-up with PCP for further evaluation and return to the ED for new or worsening symptoms.  Patient vital signs have remained stable throughout the course of patient's time in the ED. Low suspicion for any other emergent pathology at this time. I believe this patient is safe to be discharged. Provided strict return to ER precautions. Patient expressed agreement and  understanding of plan. All questions were answered.  Differential diagnoses prior to evaluation: The emergent differential diagnosis includes, but is not limited to, ACS, PE, anxiety, panic, arrhythmia, URI, pneumonia, metabolic disturbance, this is not an exhaustive differential.   Past Medical History / Co-morbidities / Social History: Gout, morbid obesity  Additional history: Chart reviewed. Pertinent results include:   Last seen by PCP on 02/08/2024 started on Wegovy . Blood pressure controlled with losartan .  Lab Tests/Imaging studies: I personally interpreted labs/imaging and the pertinent results include:    CBC unremarkable. CMP notes a mildly elevated alk phos but otherwise unremarkable Lipase unremarkable D-dimer unremarkable Troponin unremarkable  Cardiac monitoring: EKG obtained and interpreted by myself and attending physician which shows: sinus rhythm with short PR interval and borderline T abnormalities  EKG Interpretation Date/Time:  Wednesday July 03 2024 17:32:11 EDT Ventricular Rate:  96 PR Interval:  104 QRS Duration:  92 QT Interval:  334 QTC Calculation: 422 R Axis:   21  Text Interpretation: Sinus rhythm Short PR interval Borderline T abnormalities, inferior leads No old tracing to compare Confirmed by Randol Simmonds [phone]) on 07/03/2024 5:43:41 PM          Medications:  I have reviewed the patients home medicines and have made adjustments as needed.  Critical Interventions: None  Social  Determinants of Health: Has good follow-up with PCP  Disposition: After consideration of the diagnostic results and the patients response to treatment, I feel that the patient would benefit from discharge and treatment as above.   emergency department workup does not suggest an emergent condition requiring admission or immediate intervention beyond what has been performed at this time. The plan is: Follow-up with PCP, return for new or worsening symptoms. The patient is safe for discharge and has been instructed to return immediately for worsening symptoms, change in symptoms or any other concerns.   Final diagnoses:  Tachycardia  Palpitations    ED Discharge Orders     None          Beola Terrall GORMAN DEVONNA 07/03/24 2148    Randol Simmonds, MD 07/05/24 [phone]

## 2024-07-03 NOTE — ED Triage Notes (Signed)
 Feeling nauseated and palpitations. Denies abd pain, cp, sob. HR 120's at home. Seen at [facility] sent for EKG - sinus tach 102bpm. Denies fevers, chills. Some congestion, worse at night. Metformin  daily. Does not check CBG daily. HX gout and HTN.

## 2024-07-03 NOTE — Discharge Instructions (Signed)
 You were seen today for palpitations and fast heart rate.  Suspecting that the fast heart rate that they found on your EKG at urgent care was likely after having not allowed your heart to rest fully after walking.  Your lab work and imaging today were very reassuring and I have low suspicion for any emergent causes of your symptoms today.  Recommend continue to follow-up with your PCP for further monitoring as they may wish for you to undergo Holter monitoring to assess for arrhythmias.  Return to the ED though if he has any new or worsening symptoms.

## 2024-07-21 ENCOUNTER — Other Ambulatory Visit: Payer: Self-pay | Admitting: Medical

## 2024-07-26 ENCOUNTER — Ambulatory Visit: Admitting: Medical

## 2024-07-29 ENCOUNTER — Other Ambulatory Visit: Payer: Self-pay | Admitting: Medical

## 2024-10-19 ENCOUNTER — Other Ambulatory Visit: Payer: Self-pay | Admitting: Medical

## 2024-10-23 ENCOUNTER — Other Ambulatory Visit: Payer: Self-pay | Admitting: Medical

## 2024-11-21 ENCOUNTER — Other Ambulatory Visit: Payer: Self-pay | Admitting: Medical
